# Patient Record
Sex: Female | Born: 1950 | Race: White | Hispanic: No | Marital: Married | Smoking: Never smoker
Health system: Southern US, Community
[De-identification: ages and names within clinical notes are randomized; demographics above are authoritative.]

## PROBLEM LIST (undated history)

## (undated) DIAGNOSIS — K222 Esophageal obstruction: Secondary | ICD-10-CM

## (undated) DIAGNOSIS — Z87442 Personal history of urinary calculi: Secondary | ICD-10-CM

## (undated) DIAGNOSIS — Z8719 Personal history of other diseases of the digestive system: Secondary | ICD-10-CM

## (undated) DIAGNOSIS — E785 Hyperlipidemia, unspecified: Secondary | ICD-10-CM

## (undated) DIAGNOSIS — E538 Deficiency of other specified B group vitamins: Secondary | ICD-10-CM

## (undated) DIAGNOSIS — Z8489 Family history of other specified conditions: Secondary | ICD-10-CM

## (undated) DIAGNOSIS — K219 Gastro-esophageal reflux disease without esophagitis: Secondary | ICD-10-CM

## (undated) DIAGNOSIS — M179 Osteoarthritis of knee, unspecified: Secondary | ICD-10-CM

## (undated) DIAGNOSIS — K5792 Diverticulitis of intestine, part unspecified, without perforation or abscess without bleeding: Secondary | ICD-10-CM

## (undated) DIAGNOSIS — E7800 Pure hypercholesterolemia, unspecified: Secondary | ICD-10-CM

## (undated) DIAGNOSIS — M1990 Unspecified osteoarthritis, unspecified site: Secondary | ICD-10-CM

## (undated) DIAGNOSIS — M1710 Unilateral primary osteoarthritis, unspecified knee: Secondary | ICD-10-CM

## (undated) DIAGNOSIS — M810 Age-related osteoporosis without current pathological fracture: Secondary | ICD-10-CM

## (undated) DIAGNOSIS — Z8619 Personal history of other infectious and parasitic diseases: Secondary | ICD-10-CM

## (undated) DIAGNOSIS — J302 Other seasonal allergic rhinitis: Secondary | ICD-10-CM

## (undated) DIAGNOSIS — K589 Irritable bowel syndrome without diarrhea: Secondary | ICD-10-CM

## (undated) DIAGNOSIS — G8929 Other chronic pain: Secondary | ICD-10-CM

## (undated) DIAGNOSIS — K449 Diaphragmatic hernia without obstruction or gangrene: Secondary | ICD-10-CM

## (undated) DIAGNOSIS — K635 Polyp of colon: Secondary | ICD-10-CM

## (undated) DIAGNOSIS — M549 Dorsalgia, unspecified: Secondary | ICD-10-CM

## (undated) DIAGNOSIS — Z9889 Other specified postprocedural states: Secondary | ICD-10-CM

## (undated) DIAGNOSIS — L719 Rosacea, unspecified: Secondary | ICD-10-CM

## (undated) DIAGNOSIS — D049 Carcinoma in situ of skin, unspecified: Secondary | ICD-10-CM

## (undated) HISTORY — PX: COLONOSCOPY W/ POLYPECTOMY: SHX1380

## (undated) HISTORY — DX: Polyp of colon: K63.5

## (undated) HISTORY — DX: Diverticulitis of intestine, part unspecified, without perforation or abscess without bleeding: K57.92

## (undated) HISTORY — DX: Irritable bowel syndrome, unspecified: K58.9

## (undated) HISTORY — DX: Osteoarthritis of knee, unspecified: M17.9

## (undated) HISTORY — PX: ABDOMINAL HYSTERECTOMY: SHX81

## (undated) HISTORY — PX: BUNIONECTOMY: SHX129

## (undated) HISTORY — DX: Age-related osteoporosis without current pathological fracture: M81.0

## (undated) HISTORY — DX: Rosacea, unspecified: L71.9

## (undated) HISTORY — DX: Personal history of other infectious and parasitic diseases: Z86.19

## (undated) HISTORY — DX: Deficiency of other specified B group vitamins: E53.8

## (undated) HISTORY — DX: Gastro-esophageal reflux disease without esophagitis: K21.9

## (undated) HISTORY — DX: Other chronic pain: G89.29

## (undated) HISTORY — DX: Unilateral primary osteoarthritis, unspecified knee: M17.10

## (undated) HISTORY — DX: Unspecified osteoarthritis, unspecified site: M19.90

## (undated) HISTORY — DX: Hyperlipidemia, unspecified: E78.5

## (undated) HISTORY — PX: TONSILLECTOMY AND ADENOIDECTOMY: SUR1326

## (undated) HISTORY — PX: FOOT SURGERY: SHX648

## (undated) HISTORY — DX: Carcinoma in situ of skin, unspecified: D04.9

## (undated) HISTORY — DX: Diaphragmatic hernia without obstruction or gangrene: K44.9

## (undated) HISTORY — PX: CERVICAL FUSION: SHX112

## (undated) HISTORY — DX: Other seasonal allergic rhinitis: J30.2

## (undated) HISTORY — PX: OTHER SURGICAL HISTORY: SHX169

## (undated) HISTORY — DX: Personal history of other diseases of the digestive system: Z87.19

## (undated) HISTORY — DX: Dorsalgia, unspecified: M54.9

## (undated) HISTORY — DX: Pure hypercholesterolemia, unspecified: E78.00

## (undated) HISTORY — DX: Esophageal obstruction: K22.2

---
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
## 1997-11-07 ENCOUNTER — Ambulatory Visit (HOSPITAL_COMMUNITY): Admission: RE | Admit: 1997-11-07 | Discharge: 1997-11-07 | Payer: Self-pay | Admitting: Obstetrics and Gynecology

## 1998-02-23 ENCOUNTER — Inpatient Hospital Stay (HOSPITAL_COMMUNITY): Admission: RE | Admit: 1998-02-23 | Discharge: 1998-02-25 | Payer: Self-pay | Admitting: Obstetrics and Gynecology

## 1998-12-28 ENCOUNTER — Ambulatory Visit (HOSPITAL_COMMUNITY): Admission: RE | Admit: 1998-12-28 | Discharge: 1998-12-28 | Payer: Self-pay | Admitting: Obstetrics and Gynecology

## 1999-06-06 ENCOUNTER — Other Ambulatory Visit: Admission: RE | Admit: 1999-06-06 | Discharge: 1999-06-06 | Payer: Self-pay | Admitting: Obstetrics and Gynecology

## 1999-06-06 ENCOUNTER — Encounter: Admission: RE | Admit: 1999-06-06 | Discharge: 1999-09-04 | Payer: Self-pay | Admitting: Neurology

## 2000-05-30 ENCOUNTER — Encounter: Admission: RE | Admit: 2000-05-30 | Discharge: 2000-07-03 | Payer: Self-pay | Admitting: *Deleted

## 2000-07-30 ENCOUNTER — Other Ambulatory Visit: Admission: RE | Admit: 2000-07-30 | Discharge: 2000-07-30 | Payer: Self-pay | Admitting: Obstetrics and Gynecology

## 2000-12-02 ENCOUNTER — Ambulatory Visit (HOSPITAL_COMMUNITY): Admission: RE | Admit: 2000-12-02 | Discharge: 2000-12-02 | Payer: Self-pay | Admitting: Orthopedic Surgery

## 2001-11-05 ENCOUNTER — Other Ambulatory Visit: Admission: RE | Admit: 2001-11-05 | Discharge: 2001-11-05 | Payer: Self-pay | Admitting: Obstetrics and Gynecology

## 2003-03-09 ENCOUNTER — Other Ambulatory Visit: Admission: RE | Admit: 2003-03-09 | Discharge: 2003-03-09 | Payer: Self-pay | Admitting: Obstetrics and Gynecology

## 2004-03-14 ENCOUNTER — Other Ambulatory Visit: Admission: RE | Admit: 2004-03-14 | Discharge: 2004-03-14 | Payer: Self-pay | Admitting: Obstetrics and Gynecology

## 2004-12-05 ENCOUNTER — Ambulatory Visit (HOSPITAL_BASED_OUTPATIENT_CLINIC_OR_DEPARTMENT_OTHER): Admission: RE | Admit: 2004-12-05 | Discharge: 2004-12-05 | Payer: Self-pay | Admitting: Specialist

## 2006-07-07 ENCOUNTER — Encounter: Admission: RE | Admit: 2006-07-07 | Discharge: 2006-07-07 | Payer: Self-pay | Admitting: Neurological Surgery

## 2006-09-18 ENCOUNTER — Observation Stay (HOSPITAL_COMMUNITY): Admission: RE | Admit: 2006-09-18 | Discharge: 2006-09-19 | Payer: Self-pay | Admitting: Neurological Surgery

## 2006-10-13 ENCOUNTER — Encounter: Admission: RE | Admit: 2006-10-13 | Discharge: 2006-10-13 | Payer: Self-pay | Admitting: Neurological Surgery

## 2006-12-12 ENCOUNTER — Encounter: Admission: RE | Admit: 2006-12-12 | Discharge: 2006-12-12 | Payer: Self-pay | Admitting: Neurological Surgery

## 2007-03-17 ENCOUNTER — Encounter: Admission: RE | Admit: 2007-03-17 | Discharge: 2007-03-17 | Payer: Self-pay | Admitting: Neurological Surgery

## 2008-09-30 DIAGNOSIS — Z8619 Personal history of other infectious and parasitic diseases: Secondary | ICD-10-CM

## 2008-09-30 HISTORY — DX: Personal history of other infectious and parasitic diseases: Z86.19

## 2010-12-26 ENCOUNTER — Other Ambulatory Visit: Payer: Self-pay | Admitting: Obstetrics and Gynecology

## 2010-12-27 ENCOUNTER — Ambulatory Visit
Admission: RE | Admit: 2010-12-27 | Discharge: 2010-12-27 | Disposition: A | Discharge status: Home / Self Care | Payer: 59 | Source: Ambulatory Visit | Attending: Obstetrics and Gynecology | Admitting: Obstetrics and Gynecology

## 2010-12-27 MED ORDER — IOHEXOL 300 MG/ML  SOLN
100.0000 mL | Freq: Once | INTRAMUSCULAR | Status: AC | PRN
  Administered 2010-12-27: 100 mL via INTRAVENOUS

## 2011-01-15 ENCOUNTER — Other Ambulatory Visit (HOSPITAL_COMMUNITY): Payer: Self-pay | Admitting: Obstetrics and Gynecology

## 2011-01-15 DIAGNOSIS — M84350A Stress fracture, pelvis, initial encounter for fracture: Secondary | ICD-10-CM

## 2011-01-16 ENCOUNTER — Ambulatory Visit (HOSPITAL_COMMUNITY)
Admission: RE | Admit: 2011-01-16 | Discharge: 2011-01-16 | Disposition: A | Discharge status: Home / Self Care | Payer: 59 | Source: Ambulatory Visit | Attending: Obstetrics and Gynecology | Admitting: Obstetrics and Gynecology

## 2011-01-16 DIAGNOSIS — Z780 Asymptomatic menopausal state: Secondary | ICD-10-CM | POA: Insufficient documentation

## 2011-01-16 DIAGNOSIS — Z87312 Personal history of (healed) stress fracture: Secondary | ICD-10-CM | POA: Insufficient documentation

## 2011-01-16 DIAGNOSIS — Z13820 Encounter for screening for osteoporosis: Secondary | ICD-10-CM | POA: Insufficient documentation

## 2011-01-16 DIAGNOSIS — M84350A Stress fracture, pelvis, initial encounter for fracture: Secondary | ICD-10-CM

## 2011-02-15 NOTE — Op Note (Signed)
[facility]  Patient:    Tammy Cooley, Tammy Cooley                           MRN: [id number] Proc. Date: 12/02/00 Attending:  Fayrene Fearing P. Aplington, M.D.                           Operative Report  PREOPERATIVE DIAGNOSIS:  Torn medial meniscus, right knee.  POSTOPERATIVE DIAGNOSES: 1. Torn medial meniscus, right knee. 2. 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  OPERATION:  Right knee arthroscopy with partial medial meniscectomy and debridement and shaving of mediofemoral condyle.  SURGEON:  Illene Labrador. Aplington, M.D.  ASSISTANT:  Nurse.  ANESTHESIA:  General.  PATHOLOGY AND INDICATIONS FOR PROCEDURE:  She had a complex tear involving the posterior half of the medial meniscus treated arthroscopically by Dr. Jeannie Fend on June 09, 1996.  She has had DD:  12/02/00 TD:  12/03/00 Job: [id number]

## 2011-02-15 NOTE — Op Note (Signed)
NAMECICI, RODRIGES NO.:  [id number]   MEDICAL RECORD NO.:  [id number]          PATIENT TYPE:  INP   LOCATION:  3172                         FACILITY:  [facility]   PHYSICIAN:  Tia Alert, MD     DATE OF BIRTH:  09-27-1951   DATE OF PROCEDURE:  09/18/2006  DATE OF DISCHARGE:                               OPERATIVE REPORT   PREOPERATIVE DIAGNOSIS:  Cervical spondylosis with cervical instability,  C3-4, C4-5, with neck and shoulder pain.   POSTOPERATIVE DIAGNOSIS:  Cervical spondylosis with cervical  instability, C3-4, C4-5, with neck and shoulder pain.   PROCEDURE:  1. Decompressive anterior cervical diskectomy, C3-4, C4-5.  2. Anterior cervical arthrodesis, C3-4 and C4-5, utilizing a 70mm MTF      allograft at C3-4 and a 6-mm MTF allograft at C4-5.  3.  Anterior      cervical plating, C3 to C5, inclusive, utilizing a 38-mm AccuFix      plate.   SURGEON:  Marikay Alar, MD   ASSISTANT:  Aliene Beams, MD   ANESTHESIA:  General endotracheal.   COMPLICATIONS:  None apparent.   INDICATIONS FOR THE PROCEDURE:  Ms. Steinmetz is a 59 year old female who was  referred by Dr. Ethelene Hal with neck pain with shoulder pain.  She had MRI, a  CT scan and then flexion and extension views which showed cervical  spondylosis with cervical instability at C3-4 and C4-5.  She had tried  medical management for quite some time without significant relief.  I  recommended a 2-level anterior cervical diskectomy with fusion and  plating at C3-4 and C4-5.  She understood the risks, benefits, inspected  outcome and wished to proceed.   DESCRIPTION OF PROCEDURE:  The patient was taken to the operating room  and after induction of adequate generalized endotracheal anesthesia, she  was placed in supine position.  Her right anterior cervical region was  prepped with DuraPrep and then draped in the usual sterile fashion.  Three milliliters of local anesthesia were injected and a transverse  incision was made to the right of midline and carried down to the  platysma muscle, which was elevated, opened and undermined with  Metzenbaum scissors.  I then dissected in a plane medial to the  sternocleidomastoid muscle and internal carotid artery and lateral to  the trachea and esophagus to expose C34 and C4-5; intraoperative  fluoroscopy confirmed my level and then the longus colli muscles were  taken down and the Shadowline retractors were placed.  The annulus was  incised and the initial diskectomy was done with pituitary rongeurs and  curved curettes.  We then used the high-speed drill to drill the  endplates for prepare for later arthrodesis.  We drilled down to the  level of the posterior longitudinal ligament, widening the disk space at  C3-4 for 7 mm and C4-5 to 6 mm.  We opened the posterior longitudinal  ligament with a nerve hook and then removed it in a circumferential  fashion while undercutting the bodies of C3-4 and C4 and C5 at C4-5 to  decompress the central  canal.  Bilateral foraminotomies were performed,  paying particular attention to C3-4 on the left side because of her left  shoulder pain.  We then irrigated with saline solution, dried all  bleeding points, measured our interspaces and placed a 7-mm MTF  allograft at C3-4 and a 6-mm MTF allograft at C4-5.  We used a 38-mm  AccuFix plate, placed two 12-mm variable-angle screws into the bodies of  C3, C4 and C5; these locked into position by the locking mechanism on  the plate.  We then irrigated with saline solution, dried all bleeding  points with bipolar cautery and once meticulous hemostasis was achieved,  closed our platysma with 3-0 Vicryl, closed the subcuticular tissue with  3-0 Vicryl and closed the skin with Benzoin and Steri-Strips.  The  drapes removed and a sterile dressing was applied.  The patient was  awakened from general anesthesia and transferred to the recovery room in  stable condition.  At the  end of the procedure, all sponge, needle and  instrument counts were correct.      Tia Alert, MD  Electronically Signed     DSJ/MEDQ  D:  09/18/2006  T:  09/19/2006  Job:  [id number]

## 2011-02-15 NOTE — Op Note (Signed)
Tammy Cooley, Tammy Cooley                  ACCOUNT NO.:  [id number]   MEDICAL RECORD NO.:  [id number]          PATIENT TYPE:  AMB   LOCATION:  [facility]                         FACILITY:  [facility]   PHYSICIAN:  Jene Every, M.D.    DATE OF BIRTH:  Oct 29, 1950   DATE OF PROCEDURE:  12/05/2004  DATE OF DISCHARGE:                                 OPERATIVE REPORT   PREOPERATIVE DIAGNOSIS:  Degenerative joint disease, medial meniscal tear of  the left knee.   POSTOPERATIVE DIAGNOSIS:  Degenerative joint disease, medial meniscal tear  of the left knee, grade 3 chondromalacia and grade 4 chondromalacia, medial  femoral condyle, grade 3 chondromalacia of the patella, grade 2  chondromalacia, lateral tibial plateau, medial tibial plateau.   BRIEF HISTORY/INDICATIONS:  This is a 60 year old who is status post  arthroscopy in the past for degenerative changes and meniscal tear.  She had  recent pain.  Injected in the knee a few times.  Had persistent pain.  Discussed repeat arthroscopic debridement, which she has had success from in  the past.  The pain is worse with activity, better with rest.  She had been  doing exercise and anti-inflammatories to no avail.  She had occasional  mechanical symptoms.  She has ongoing MRIs.  X-rays demonstrated medial  joint space narrowing, consistent with osteoarthrosis.  Operative  intervention is indicated for diagnostic arthroscopy and appropriate  treatment.  The risks and benefits were discussed, including bleeding,  infection, damage to vascular structures, no changes in the symptoms,  worsening symptoms, the need for repeat debridement in the future, the need  for _________, anesthetic complications, the need for total knee  arthroplasty, etc.   TECHNIQUE:  With the patient in the supine position after induction of  adequate general anesthesia and 1 gm of Kefzol, the left lower extremity was  prepped and draped in the usual sterile fashion.  A lateral  parapatellar  portal and preexisting surgical portal was utilized.  The _________ was  cannulated atraumatically placed as was the camera.  Irrigant was utilized  to insufflate the joint.  Under direct visualization, an 18 gauge needle was  utilized to localize the medial parapatellar portals, which was fashioned  with a #11 blade after localization with an 18 gauge needle.  Noted  initially was extensive grade III changes and a chondral flap tear of the  medial femoral condyle.  A shaver was introduced and utilized to perform a  chondroplasty of the medial femoral condyle.  There were some grade 4  changes on the AP weightbearing surface.  There was a complex tear of the  posterior portion of the medial meniscus, unstable to probe palpation;  therefore, a basket rongeur was inserted and utilized to perform a partial  medial meniscectomy to a stable base.  The residual was stable to probe-  palpation.  Approximately 50% of the posterior third of the meniscus had to  be resected.  The remainder of the surfaces were palpated, as there were  grade III changes of the tibial plateau.  Full examination of the femoral  condyle  revealed extensive grade III and some degree IV changes of the  femoral condyle.  The rest of the meniscus was stable to probe-palpation.  ACL and PCL were essentially unremarkable.  The lateral compartments  revealed grade II changes of the entire tibial plateau.  The meniscus was  stable to probe-palpation without evidence of tear.  Examined the top and  bottom of the meniscus throughout without evidence of tear.  The surfaces  were probed and palpated.  I see no evidence of chondral defect.  Underneath  the patella are some minor grade III changes.  This was shaved as well.  There was normal  patellofemoral tracking noted.   Next, the knee was copiously lavaged.  All compartments were re-examined,  including the gutter.  No loose cartilaginous debris or other pathology   amenable to arthroscopic intervention.  The knee was copiously lavaged.  The  portals were closed with 4-0 nylon simple sutures.  Then 0.25% Marcaine with  epinephrine was infiltrated into  the wound.  The wound was dressed sterilely.  She was awakened without  difficulty and transported to the recovery room in satisfactory condition.   The patient tolerated the procedure well.  There were no consultation.      JB/MEDQ  D:  12/05/2004  T:  12/05/2004  Job:  [id number]

## 2011-02-15 NOTE — Op Note (Signed)
NAMEADJA, RUFF                  ACCOUNT NO.:  [id number]   MEDICAL RECORD NO.:  [id number]          PATIENT TYPE:  AMB   LOCATION:  [facility]                         FACILITY:  [facility]   PHYSICIAN:  Jene Every, M.D.    DATE OF BIRTH:  [birth year]   DATE OF PROCEDURE:  12/05/2004  DATE OF DISCHARGE:  12/05/2004                                 OPERATIVE REPORT   ADDENDUM:  I believe the procedure was actually left out of the operative  report.  Under Procedure category:  Left knee arthroscopy, partial medial  meniscectomy, chondroplasty of the medial femoral condyle, medial tibial  plateau, patella.  That is the end of that procedure addendum.   Now under the Brief History and Indications, the last sentence, there is  an open slot.  It should read:  The need for medications, anesthetic  complications, etc.   Under Technique, the third sentence should read:  The ingress cannula was  atraumatically placed and the camera was inserted.      JB/MEDQ  D:  12/25/2004  T:  12/25/2004  Job:  [id number]

## 2011-02-15 NOTE — Op Note (Signed)
[facility]  Patient:    Tammy Cooley, Tammy Cooley                         MRN: [id number] Proc. Date: 12/02/00 Adm. Date:  [id number] Attending:  Marlowe Kays Page                           Operative Report  PREOPERATIVE DIAGNOSIS:  Torn medial meniscus, right knee.  POSTOPERATIVE DIAGNOSES: 1. Torn medial meniscus, right knee. 2. Grade I to II chondromalacia of the mediofemoral condyle, right knee.  OPERATION:  Right knee arthroscopy with partial medial meniscectomy and debridement and shaving of mediofemoral condyle.  SURGEON:  Illene Labrador. Aplington, M.D.  ASSISTANT:  Nurse.  ANESTHESIA:  General.  PATHOLOGY AND INDICATIONS FOR PROCEDURE:  In 1997, she had a complex posterior horn tear treated arthroscopically by Dr. Jeannie Fend.  The right knee symptoms began some two to three years ago and has gotten worse over the last six months to the point where she is having problems doing any squatting, bending, or working in the garden.  Plain x-rays look normal.  Because of what appeared to be a clearcut picture of torn medial meniscus, no MRI was performed, and she is here today for the above-mentioned surgery.  See operative description below.  DESCRIPTION OF PROCEDURE:  Satisfactory general anesthesia, pneumatic tourniquet applied, stabilizer.  The right knee was prepped with DuraPrep and draped in sterile field.  Superior medial saline inflow.  First through an anterolateral portal in medial compartment, knee was evaluated.   Immediately apparent was a good bit of reactive synovitis in the anterior third of the joint which was resected and found to be adherent to the medial meniscus which had been torn.  There was also an unusual anatomy to the meniscus going well in front of the ACL.  All this was pictured.  The medial meniscus was shaved down until smooth.  Corresponding to the tear in the medial meniscus and the synovitis was a small area of chondromalacia  of the weeping portion of the mediofemoral condyle which was partially detached.  I managed this by debriding it down with baskets and shaving down gently until smooth with a 3.5 shaver.  On looking posteriorly, the medial meniscus appeared to be completely normal and was stable on probing.  Looking at the medial gutter and suprapatellar area, no abnormalities were noted.  I then reversed the portals. Her ACL was intact.  The lateral joint looked normal and representative picture was taken.  Looking at the lateral gutter, no abnormalities were noted.  The knee joint was then irrigated until clear.  All fluid possible was removed.  The two anterior portals were closed with 4-0 nylon 20 cc 0.5% Marcaine with adrenaline, 4 mg of morphine were then instilled through inflow apparatus which were removed and portal closed with 4-0 nylon as well.  Betadine and Adaptic dry sterile dressing were applied.  Tourniquet was released.  She tolerated the procedure well and was taken to the recovery room in satisfactory condition with no known complications. DD:  12/02/00 TD:  12/03/00 Job: [id number]

## 2011-04-09 ENCOUNTER — Encounter (HOSPITAL_COMMUNITY): Payer: 59 | Attending: Obstetrics and Gynecology

## 2011-04-09 DIAGNOSIS — M949 Disorder of cartilage, unspecified: Secondary | ICD-10-CM | POA: Insufficient documentation

## 2011-04-09 DIAGNOSIS — M899 Disorder of bone, unspecified: Secondary | ICD-10-CM | POA: Insufficient documentation

## 2011-05-06 ENCOUNTER — Other Ambulatory Visit (HOSPITAL_COMMUNITY): Payer: Self-pay | Admitting: Podiatry

## 2011-05-06 DIAGNOSIS — S92009A Unspecified fracture of unspecified calcaneus, initial encounter for closed fracture: Secondary | ICD-10-CM

## 2011-05-06 DIAGNOSIS — S92109A Unspecified fracture of unspecified talus, initial encounter for closed fracture: Secondary | ICD-10-CM

## 2011-05-14 ENCOUNTER — Encounter (HOSPITAL_COMMUNITY): Payer: 59

## 2011-05-14 ENCOUNTER — Other Ambulatory Visit (HOSPITAL_COMMUNITY): Payer: 59

## 2013-07-23 ENCOUNTER — Encounter: Payer: Self-pay | Admitting: Gastroenterology

## 2013-08-30 ENCOUNTER — Ambulatory Visit: Payer: 59 | Admitting: Gastroenterology

## 2014-11-28 ENCOUNTER — Encounter: Payer: Self-pay | Admitting: *Deleted

## 2015-12-04 DIAGNOSIS — M810 Age-related osteoporosis without current pathological fracture: Secondary | ICD-10-CM | POA: Insufficient documentation

## 2016-12-20 ENCOUNTER — Emergency Department (HOSPITAL_COMMUNITY): Admission: EM | Admit: 2016-12-20 | Discharge: 2016-12-20 | Discharge status: Absent without leave | Payer: Self-pay

## 2017-03-05 ENCOUNTER — Other Ambulatory Visit: Payer: Self-pay | Admitting: Physician Assistant

## 2017-03-05 DIAGNOSIS — M8580 Other specified disorders of bone density and structure, unspecified site: Secondary | ICD-10-CM

## 2017-03-05 DIAGNOSIS — M810 Age-related osteoporosis without current pathological fracture: Secondary | ICD-10-CM

## 2017-03-21 ENCOUNTER — Other Ambulatory Visit: Payer: Self-pay | Admitting: Obstetrics & Gynecology

## 2017-03-21 DIAGNOSIS — N63 Unspecified lump in unspecified breast: Secondary | ICD-10-CM

## 2017-04-21 ENCOUNTER — Ambulatory Visit
Admission: RE | Admit: 2017-04-21 | Discharge: 2017-04-21 | Disposition: A | Discharge status: Home / Self Care | Payer: BLUE CROSS/BLUE SHIELD | Source: Ambulatory Visit | Attending: Physician Assistant | Admitting: Physician Assistant

## 2017-04-21 ENCOUNTER — Ambulatory Visit
Admission: RE | Admit: 2017-04-21 | Discharge: 2017-04-21 | Disposition: A | Discharge status: Home / Self Care | Payer: BLUE CROSS/BLUE SHIELD | Source: Ambulatory Visit | Attending: Obstetrics & Gynecology | Admitting: Obstetrics & Gynecology

## 2017-04-21 ENCOUNTER — Other Ambulatory Visit: Payer: Self-pay

## 2017-04-21 DIAGNOSIS — M810 Age-related osteoporosis without current pathological fracture: Secondary | ICD-10-CM

## 2017-04-21 DIAGNOSIS — M8580 Other specified disorders of bone density and structure, unspecified site: Secondary | ICD-10-CM

## 2017-04-21 DIAGNOSIS — N63 Unspecified lump in unspecified breast: Secondary | ICD-10-CM

## 2017-05-09 DIAGNOSIS — K580 Irritable bowel syndrome with diarrhea: Secondary | ICD-10-CM | POA: Insufficient documentation

## 2017-05-09 DIAGNOSIS — K219 Gastro-esophageal reflux disease without esophagitis: Secondary | ICD-10-CM | POA: Insufficient documentation

## 2017-10-29 DIAGNOSIS — M179 Osteoarthritis of knee, unspecified: Secondary | ICD-10-CM | POA: Insufficient documentation

## 2018-05-25 DIAGNOSIS — M7742 Metatarsalgia, left foot: Secondary | ICD-10-CM | POA: Diagnosis not present

## 2018-05-25 DIAGNOSIS — M79672 Pain in left foot: Secondary | ICD-10-CM | POA: Diagnosis not present

## 2018-05-25 DIAGNOSIS — M21612 Bunion of left foot: Secondary | ICD-10-CM | POA: Diagnosis not present

## 2018-06-12 DIAGNOSIS — Z01419 Encounter for gynecological examination (general) (routine) without abnormal findings: Secondary | ICD-10-CM | POA: Diagnosis not present

## 2018-06-12 DIAGNOSIS — Z1231 Encounter for screening mammogram for malignant neoplasm of breast: Secondary | ICD-10-CM | POA: Diagnosis not present

## 2018-06-19 DIAGNOSIS — M79672 Pain in left foot: Secondary | ICD-10-CM | POA: Diagnosis not present

## 2018-06-22 DIAGNOSIS — M21612 Bunion of left foot: Secondary | ICD-10-CM | POA: Diagnosis not present

## 2018-06-22 DIAGNOSIS — M2022 Hallux rigidus, left foot: Secondary | ICD-10-CM | POA: Diagnosis not present

## 2018-06-22 DIAGNOSIS — M7742 Metatarsalgia, left foot: Secondary | ICD-10-CM | POA: Diagnosis not present

## 2018-07-13 ENCOUNTER — Other Ambulatory Visit (HOSPITAL_COMMUNITY): Payer: Self-pay | Admitting: Orthopedic Surgery

## 2018-09-09 ENCOUNTER — Encounter (HOSPITAL_BASED_OUTPATIENT_CLINIC_OR_DEPARTMENT_OTHER): Payer: Self-pay | Admitting: *Deleted

## 2018-09-09 ENCOUNTER — Other Ambulatory Visit: Payer: Self-pay

## 2018-09-17 ENCOUNTER — Ambulatory Visit (HOSPITAL_BASED_OUTPATIENT_CLINIC_OR_DEPARTMENT_OTHER): Payer: Medicare HMO | Admitting: Anesthesiology

## 2018-09-17 ENCOUNTER — Encounter (HOSPITAL_BASED_OUTPATIENT_CLINIC_OR_DEPARTMENT_OTHER)
Admission: RE | Disposition: A | Discharge status: Home / Self Care | Payer: Self-pay | Source: Home / Self Care | Attending: Orthopedic Surgery

## 2018-09-17 ENCOUNTER — Ambulatory Visit (HOSPITAL_BASED_OUTPATIENT_CLINIC_OR_DEPARTMENT_OTHER)
Admission: RE | Admit: 2018-09-17 | Discharge: 2018-09-17 | Disposition: A | Discharge status: Home / Self Care | Payer: Medicare HMO | Attending: Orthopedic Surgery | Admitting: Orthopedic Surgery

## 2018-09-17 ENCOUNTER — Other Ambulatory Visit: Payer: Self-pay

## 2018-09-17 ENCOUNTER — Encounter (HOSPITAL_BASED_OUTPATIENT_CLINIC_OR_DEPARTMENT_OTHER): Payer: Self-pay

## 2018-09-17 DIAGNOSIS — Z79899 Other long term (current) drug therapy: Secondary | ICD-10-CM | POA: Insufficient documentation

## 2018-09-17 DIAGNOSIS — M170 Bilateral primary osteoarthritis of knee: Secondary | ICD-10-CM | POA: Diagnosis not present

## 2018-09-17 DIAGNOSIS — Z7982 Long term (current) use of aspirin: Secondary | ICD-10-CM | POA: Diagnosis not present

## 2018-09-17 DIAGNOSIS — K219 Gastro-esophageal reflux disease without esophagitis: Secondary | ICD-10-CM | POA: Diagnosis not present

## 2018-09-17 DIAGNOSIS — M2022 Hallux rigidus, left foot: Secondary | ICD-10-CM | POA: Diagnosis not present

## 2018-09-17 DIAGNOSIS — M19072 Primary osteoarthritis, left ankle and foot: Secondary | ICD-10-CM | POA: Diagnosis not present

## 2018-09-17 DIAGNOSIS — M7742 Metatarsalgia, left foot: Secondary | ICD-10-CM | POA: Diagnosis not present

## 2018-09-17 DIAGNOSIS — G8918 Other acute postprocedural pain: Secondary | ICD-10-CM | POA: Diagnosis not present

## 2018-09-17 DIAGNOSIS — M21612 Bunion of left foot: Secondary | ICD-10-CM | POA: Insufficient documentation

## 2018-09-17 HISTORY — PX: ARTHRODESIS METATARSALPHALANGEAL JOINT (MTPJ): SHX6566

## 2018-09-17 SURGERY — FUSION, JOINT, GREAT TOE
Anesthesia: General | Site: Foot | Laterality: Left

## 2018-09-17 MED ORDER — BUPIVACAINE-EPINEPHRINE (PF) 0.5% -1:200000 IJ SOLN
INTRAMUSCULAR | Status: DC | PRN
  Administered 2018-09-17: 25 mL via PERINEURAL

## 2018-09-17 MED ORDER — FENTANYL CITRATE (PF) 100 MCG/2ML IJ SOLN
50.0000 ug | INTRAMUSCULAR | Status: DC | PRN
  Administered 2018-09-17: 50 ug via INTRAVENOUS

## 2018-09-17 MED ORDER — CEFAZOLIN SODIUM-DEXTROSE 2-4 GM/100ML-% IV SOLN
INTRAVENOUS | Status: AC
  Filled 2018-09-17: qty 100

## 2018-09-17 MED ORDER — FENTANYL CITRATE (PF) 100 MCG/2ML IJ SOLN
INTRAMUSCULAR | Status: AC
  Filled 2018-09-17: qty 2

## 2018-09-17 MED ORDER — OXYCODONE HCL 5 MG PO TABS: 5.0000 mg | ORAL_TABLET | ORAL | 0 refills | Status: AC | PRN

## 2018-09-17 MED ORDER — MIDAZOLAM HCL 2 MG/2ML IJ SOLN
1.0000 mg | INTRAMUSCULAR | Status: DC | PRN
  Administered 2018-09-17: 1 mg via INTRAVENOUS

## 2018-09-17 MED ORDER — DOCUSATE SODIUM 100 MG PO CAPS: 100.0000 mg | ORAL_CAPSULE | Freq: Two times a day (BID) | ORAL | 0 refills | Status: DC

## 2018-09-17 MED ORDER — FENTANYL CITRATE (PF) 100 MCG/2ML IJ SOLN: 25.0000 ug | INTRAMUSCULAR | Status: DC | PRN

## 2018-09-17 MED ORDER — SENNA 8.6 MG PO TABS: 2.0000 | ORAL_TABLET | Freq: Two times a day (BID) | ORAL | 0 refills | Status: DC

## 2018-09-17 MED ORDER — PROPOFOL 10 MG/ML IV BOLUS
INTRAVENOUS | Status: DC | PRN
  Administered 2018-09-17: 150 mg via INTRAVENOUS

## 2018-09-17 MED ORDER — CHLORHEXIDINE GLUCONATE 4 % EX LIQD: 60.0000 mL | Freq: Once | CUTANEOUS | Status: DC

## 2018-09-17 MED ORDER — CEFAZOLIN SODIUM-DEXTROSE 2-4 GM/100ML-% IV SOLN
2.0000 g | INTRAVENOUS | Status: AC
  Administered 2018-09-17: 2 g via INTRAVENOUS

## 2018-09-17 MED ORDER — SCOPOLAMINE 1 MG/3DAYS TD PT72: 1.0000 | MEDICATED_PATCH | Freq: Once | TRANSDERMAL | Status: DC | PRN

## 2018-09-17 MED ORDER — DEXAMETHASONE SODIUM PHOSPHATE 4 MG/ML IJ SOLN
INTRAMUSCULAR | Status: DC | PRN
  Administered 2018-09-17: 10 mg via INTRAVENOUS

## 2018-09-17 MED ORDER — SODIUM CHLORIDE 0.9 % IV SOLN: INTRAVENOUS | Status: DC

## 2018-09-17 MED ORDER — MIDAZOLAM HCL 2 MG/2ML IJ SOLN
INTRAMUSCULAR | Status: AC
  Filled 2018-09-17: qty 2

## 2018-09-17 MED ORDER — LACTATED RINGERS IV SOLN
INTRAVENOUS | Status: DC
  Administered 2018-09-17 (×2): via INTRAVENOUS

## 2018-09-17 SURGICAL SUPPLY — 84 items
BANDAGE ACE 4X5 VEL STRL LF (GAUZE/BANDAGES/DRESSINGS) IMPLANT
BANDAGE ESMARK 6X9 LF (GAUZE/BANDAGES/DRESSINGS) IMPLANT
BIT DRILL 2.0 (BIT) ×2
BIT DRILL 2.9 CANN QC NONSTRL (BIT) ×1 IMPLANT
BIT DRILL 2XNS DISP SS SM FRAG (BIT) IMPLANT
BIT DRL 2XNS DISP SS SM FRAG (BIT) ×1
BLADE AVERAGE 25X9 (BLADE) ×1 IMPLANT
BLADE MICRO SAGITTAL (BLADE) ×1 IMPLANT
BLADE OSC/SAG .038X5.5 CUT EDG (BLADE) IMPLANT
BLADE SURG 15 STRL LF DISP TIS (BLADE) ×2 IMPLANT
BLADE SURG 15 STRL SS (BLADE) ×4
BNDG CMPR 9X6 STRL LF SNTH (GAUZE/BANDAGES/DRESSINGS)
BNDG COHESIVE 4X5 TAN STRL (GAUZE/BANDAGES/DRESSINGS) IMPLANT
BNDG COHESIVE 6X5 TAN STRL LF (GAUZE/BANDAGES/DRESSINGS) IMPLANT
BNDG CONFORM 3 STRL LF (GAUZE/BANDAGES/DRESSINGS) ×3 IMPLANT
BNDG ESMARK 6X9 LF (GAUZE/BANDAGES/DRESSINGS)
BOOT STEPPER DURA LG (SOFTGOODS) IMPLANT
BOOT STEPPER DURA MED (SOFTGOODS) ×1 IMPLANT
BOOT STEPPER DURA SM (SOFTGOODS) IMPLANT
BOOT STEPPER DURA XLG (SOFTGOODS) IMPLANT
CHLORAPREP W/TINT 26ML (MISCELLANEOUS) ×2 IMPLANT
COVER BACK TABLE 60X90IN (DRAPES) ×2 IMPLANT
COVER WAND RF STERILE (DRAPES) IMPLANT
CUFF TOURNIQUET SINGLE 34IN LL (TOURNIQUET CUFF) ×1 IMPLANT
DRAPE EXTREMITY T 121X128X90 (DRAPE) ×2 IMPLANT
DRAPE OEC MINIVIEW 54X84 (DRAPES) ×2 IMPLANT
DRAPE U-SHAPE 47X51 STRL (DRAPES) ×2 IMPLANT
DRSG MEPITEL 4X7.2 (GAUZE/BANDAGES/DRESSINGS) ×2 IMPLANT
DRSG PAD ABDOMINAL 8X10 ST (GAUZE/BANDAGES/DRESSINGS) ×4 IMPLANT
ELECT REM PT RETURN 9FT ADLT (ELECTROSURGICAL) ×2
ELECTRODE REM PT RTRN 9FT ADLT (ELECTROSURGICAL) ×1 IMPLANT
GAUZE SPONGE 4X4 12PLY STRL (GAUZE/BANDAGES/DRESSINGS) ×2 IMPLANT
GLOVE BIO SURGEON STRL SZ 6.5 (GLOVE) ×1 IMPLANT
GLOVE BIO SURGEON STRL SZ8 (GLOVE) ×2 IMPLANT
GLOVE BIOGEL PI IND STRL 7.0 (GLOVE) IMPLANT
GLOVE BIOGEL PI IND STRL 8 (GLOVE) ×2 IMPLANT
GLOVE BIOGEL PI INDICATOR 7.0 (GLOVE) ×1
GLOVE BIOGEL PI INDICATOR 8 (GLOVE) ×2
GLOVE ECLIPSE 8.0 STRL XLNG CF (GLOVE) ×2 IMPLANT
GOWN STRL REUS W/ TWL LRG LVL3 (GOWN DISPOSABLE) ×1 IMPLANT
GOWN STRL REUS W/ TWL XL LVL3 (GOWN DISPOSABLE) ×2 IMPLANT
GOWN STRL REUS W/TWL LRG LVL3 (GOWN DISPOSABLE) ×2
GOWN STRL REUS W/TWL XL LVL3 (GOWN DISPOSABLE) ×4
K-WIRE ACE 1.6X6 (WIRE) ×4
KWIRE ACE 1.6X6 (WIRE) IMPLANT
NEEDLE HYPO 22GX1.5 SAFETY (NEEDLE) IMPLANT
PACK BASIN DAY SURGERY FS (CUSTOM PROCEDURE TRAY) ×2 IMPLANT
PAD CAST 4YDX4 CTTN HI CHSV (CAST SUPPLIES) ×1 IMPLANT
PADDING CAST ABS 4INX4YD NS (CAST SUPPLIES)
PADDING CAST ABS COTTON 4X4 ST (CAST SUPPLIES) IMPLANT
PADDING CAST COTTON 4X4 STRL (CAST SUPPLIES) ×2
PADDING CAST COTTON 6X4 STRL (CAST SUPPLIES) IMPLANT
PENCIL BUTTON HOLSTER BLD 10FT (ELECTRODE) ×2 IMPLANT
PLATE SM 1/4 TUBULAR 6H (Plate) ×1 IMPLANT
SANITIZER HAND PURELL 535ML FO (MISCELLANEOUS) ×2 IMPLANT
SCREW CORTICAL 2.7MM  14MM (Screw) ×1 IMPLANT
SCREW CORTICAL 2.7MM  18MM (Screw) ×2 IMPLANT
SCREW CORTICAL 2.7MM 14MM (Screw) IMPLANT
SCREW CORTICAL 2.7MM 16MM (Screw) ×1 IMPLANT
SCREW CORTICAL 2.7MM 18MM (Screw) IMPLANT
SCREW HCS TWIST-OFF 2.0X12MM (Screw) ×1 IMPLANT
SCREW HCS TWIST-OFF 2.0X14MM (Screw) ×1 IMPLANT
SCREW LAG  RD HEAD 4.0 34 LTH (Screw) ×1 IMPLANT
SCREW LAG RD HEAD 4.0 34 LTH (Screw) IMPLANT
SHEET MEDIUM DRAPE 40X70 STRL (DRAPES) ×2 IMPLANT
SLEEVE SCD COMPRESS KNEE MED (MISCELLANEOUS) ×1 IMPLANT
SPLINT FAST PLASTER 5X30 (CAST SUPPLIES)
SPLINT PLASTER CAST FAST 5X30 (CAST SUPPLIES) IMPLANT
SPONGE LAP 18X18 RF (DISPOSABLE) ×2 IMPLANT
SPONGE SURGIFOAM ABS GEL 12-7 (HEMOSTASIS) IMPLANT
STOCKINETTE 6  STRL (DRAPES) ×1
STOCKINETTE 6 STRL (DRAPES) ×1 IMPLANT
SUCTION FRAZIER HANDLE 10FR (MISCELLANEOUS) ×1
SUCTION TUBE FRAZIER 10FR DISP (MISCELLANEOUS) ×1 IMPLANT
SUT ETHILON 3 0 PS 1 (SUTURE) ×2 IMPLANT
SUT MNCRL AB 3-0 PS2 18 (SUTURE) ×2 IMPLANT
SUT VIC AB 0 SH 27 (SUTURE) IMPLANT
SUT VIC AB 2-0 SH 27 (SUTURE) ×2
SUT VIC AB 2-0 SH 27XBRD (SUTURE) ×1 IMPLANT
SYR BULB 3OZ (MISCELLANEOUS) ×2 IMPLANT
SYR CONTROL 10ML LL (SYRINGE) IMPLANT
TOWEL GREEN STERILE FF (TOWEL DISPOSABLE) ×4 IMPLANT
TUBE CONNECTING 20X1/4 (TUBING) ×2 IMPLANT
UNDERPAD 30X30 (UNDERPADS AND DIAPERS) ×2 IMPLANT

## 2018-09-17 NOTE — Progress Notes (Signed)
Assisted Dr. Rob Fitzgerald with left, ultrasound guided, popliteal block. Side rails up, monitors on throughout procedure. See vital signs in flow sheet. Tolerated Procedure well. 

## 2018-09-17 NOTE — H&P (Signed)
Tammy Cooley is an 67 y.o. female.   Chief Complaint: Left forefoot pain HPI: The patient is a 67 year old female without significant past medical history.  She has a long history of left forefoot pain due to hallux rigidus and metatarsalgia.  She has failed nonoperative treatment to date including activity modification, oral anti-inflammatories and shoewear modification.  She presents now for surgical treatment of these painful conditions.  Past Medical History:  Diagnosis Date  . Acne rosacea   . Back pain   . Colon polyp, hyperplastic   . Diverticulitis   . GERD (gastroesophageal reflux disease)   . History of shingles 2010  . OA (osteoarthritis) of knee    bilateral  . OP (osteoporosis)   . Seasonal allergies     Past Surgical History:  Procedure Laterality Date  . ABDOMINAL HYSTERECTOMY     total 1999  . COLONOSCOPY W/ POLYPECTOMY    . left knee arthroscopy    . right knee arthroscopy      Family History  Problem Relation Age of Onset  . Breast cancer Mother    Social History:  reports that she has never smoked. She has never used smokeless tobacco. She reports that she does not drink alcohol or use drugs.  Allergies: No Known Allergies  Medications Prior to Admission  Medication Sig Dispense Refill  . aspirin EC 81 MG tablet Take 81 mg by mouth daily.    . calcium carbonate (TUMS) 500 MG chewable tablet Chew 1 tablet by mouth daily.    . cholecalciferol (VITAMIN D3) 25 MCG (1000 UT) tablet Take 1,000 Units by mouth daily.    Marland Kitchen loratadine (CLARITIN) 10 MG tablet Take 10 mg by mouth daily.    . naproxen sodium (ALEVE) 220 MG tablet Take 220 mg by mouth.      No results found for this or any previous visit (from the past 48 hour(s)). No results found.  ROS no recent fever, chills, nausea, vomiting or changes in her appetite  Blood pressure 129/81, pulse 78, temperature 97.9 F (36.6 C), temperature source Oral, resp. rate 16, height 5\' 1"  (1.549 m), weight 67.4  kg, SpO2 99 %. Physical Exam  Well-nourished well-developed woman in no apparent distress.  Alert and oriented x4.  Mood and affect are normal.  Extraocular motions are intact.  Respirations are unlabored.  Gait is normal.  The left hallux has decreased range of motion and tenderness to palpation at the MP joint.  Skin is healthy and intact.  Pulses are palpable.  No lymphadenopathy.  5 out of 5 strength in plantar flexion and dorsiflexion of the ankle and toes.  Tender to palpation at the second TMT joint and second MTP joint.  Assessment/Plan Left hallux rigidus and second metatarsalgia -to the operating room today for hallux MP joint arthrodesis and second metatarsal Weil osteotomy.  The risks and benefits of the alternative treatment options have been discussed in detail.  The patient wishes to proceed with surgery and specifically understands risks of bleeding, infection, nerve damage, blood clots, need for additional surgery, amputation and death.   Wylene Simmer, MD 10-03-2018, 10:02 AM

## 2018-09-17 NOTE — Anesthesia Preprocedure Evaluation (Addendum)
Anesthesia Evaluation  Patient identified by MRN, date of birth, ID band Patient awake    Reviewed: Allergy & Precautions, NPO status , Patient's Chart, lab work & pertinent test results  Airway Mallampati: II  TM Distance: >3 FB Neck ROM: Full    Dental   Pulmonary neg pulmonary ROS,    breath sounds clear to auscultation       Cardiovascular negative cardio ROS   Rhythm:Regular Rate:Normal     Neuro/Psych negative neurological ROS     GI/Hepatic Neg liver ROS, GERD  ,  Endo/Other  negative endocrine ROS  Renal/GU negative Renal ROS     Musculoskeletal  (+) Arthritis ,   Abdominal   Peds  Hematology negative hematology ROS (+)   Anesthesia Other Findings   Reproductive/Obstetrics                             Anesthesia Physical Anesthesia Plan  ASA: II  Anesthesia Plan: General   Post-op Pain Management:  Regional for Post-op pain   Induction: Intravenous  PONV Risk Score and Plan: 3 and Dexamethasone, Ondansetron and Treatment may vary due to age or medical condition  Airway Management Planned: LMA  Additional Equipment:   Intra-op Plan:   Post-operative Plan: Extubation in OR  Informed Consent: I have reviewed the patients History and Physical, chart, labs and discussed the procedure including the risks, benefits and alternatives for the proposed anesthesia with the patient or authorized representative who has indicated his/her understanding and acceptance.   Dental advisory given  Plan Discussed with: CRNA  Anesthesia Plan Comments:       Anesthesia Quick Evaluation

## 2018-09-17 NOTE — Discharge Instructions (Addendum)
Tammy Simmer, MD [place]  Please read the following information regarding your care after surgery.  Medications  You only need a prescription for the narcotic pain medicine (ex. oxycodone, Percocet, Norco).  All of the other medicines listed below are available over the counter. X Aleve 2 pills twice a day for the first 3 days after surgery. X acetominophen (Tylenol) 650 mg every 4-6 hours as you need for minor to moderate pain X oxycodone as prescribed for severe pain  Narcotic pain medicine (ex. oxycodone, Percocet, Vicodin) will cause constipation.  To prevent this problem, take the following medicines while you are taking any pain medicine. X docusate sodium (Colace) 100 mg twice a day X senna (Senokot) 2 tablets twice a day  Weight Bearing X Bear weight only on your operated foot in the CAM boot.  Cast / Splint / Dressing X Keep your splint, cast or dressing clean and dry.  Dont put anything (coat hanger, pencil, etc) down inside of it.  If it gets damp, use a hair dryer on the cool setting to dry it.  If it gets soaked, call the office to schedule an appointment for a cast change.   After your dressing, cast or splint is removed; you may shower, but do not soak or scrub the wound.  Allow the water to run over it, and then gently pat it dry.  Swelling It is normal for you to have swelling where you had surgery.  To reduce swelling and pain, keep your toes above your nose for at least 3 days after surgery.  It may be necessary to keep your foot or leg elevated for several weeks.  If it hurts, it should be elevated.  Follow Up Call my office at [phone] when you are discharged from the hospital or surgery center to schedule an appointment to be seen two weeks after surgery.  Call my office at [phone] if you develop a fever >101.5 F, nausea, vomiting, bleeding from the surgical site or severe pain.        Post Anesthesia Home Care  Instructions  Activity: Get plenty of rest for the remainder of the day. A responsible individual must stay with you for 24 hours following the procedure.  For the next 24 hours, DO NOT: -Drive a car -Paediatric nurse -Drink alcoholic beverages -Take any medication unless instructed by your physician -Make any legal decisions or sign important papers.  Meals: Start with liquid foods such as gelatin or soup. Progress to regular foods as tolerated. Avoid greasy, spicy, heavy foods. If nausea and/or vomiting occur, drink only clear liquids until the nausea and/or vomiting subsides. Call your physician if vomiting continues.  Special Instructions/Symptoms: Your throat may feel dry or sore from the anesthesia or the breathing tube placed in your throat during surgery. If this causes discomfort, gargle with warm salt water. The discomfort should disappear within 24 hours.  If you had a scopolamine patch placed behind your ear for the management of post- operative nausea and/or vomiting:  1. The medication in the patch is effective for 72 hours, after which it should be removed.  Wrap patch in a tissue and discard in the trash. Wash hands thoroughly with soap and water. 2. You may remove the patch earlier than 72 hours if you experience unpleasant side effects which may include dry mouth, dizziness or visual disturbances. 3. Avoid touching the patch. Wash your hands with soap and water after contact with the patch.    Post Anesthesia  Home Care Instructions  Activity: Get plenty of rest for the remainder of the day. A responsible individual must stay with you for 24 hours following the procedure.  For the next 24 hours, DO NOT: -Drive a car -Paediatric nurse -Drink alcoholic beverages -Take any medication unless instructed by your physician -Make any legal decisions or sign important papers.  Meals: Start with liquid foods such as gelatin or soup. Progress to regular foods as tolerated.  Avoid greasy, spicy, heavy foods. If nausea and/or vomiting occur, drink only clear liquids until the nausea and/or vomiting subsides. Call your physician if vomiting continues.  Special Instructions/Symptoms: Your throat may feel dry or sore from the anesthesia or the breathing tube placed in your throat during surgery. If this causes discomfort, gargle with warm salt water. The discomfort should disappear within 24 hours.  If you had a scopolamine patch placed behind your ear for the management of post- operative nausea and/or vomiting:  1. The medication in the patch is effective for 72 hours, after which it should be removed.  Wrap patch in a tissue and discard in the trash. Wash hands thoroughly with soap and water. 2. You may remove the patch earlier than 72 hours if you experience unpleasant side effects which may include dry mouth, dizziness or visual disturbances. 3. Avoid touching the patch. Wash your hands with soap and water after contact with the patch.    Post Anesthesia Home Care Instructions  Activity: Get plenty of rest for the remainder of the day. A responsible individual must stay with you for 24 hours following the procedure.  For the next 24 hours, DO NOT: -Drive a car -Paediatric nurse -Drink alcoholic beverages -Take any medication unless instructed by your physician -Make any legal decisions or sign important papers.  Meals: Start with liquid foods such as gelatin or soup. Progress to regular foods as tolerated. Avoid greasy, spicy, heavy foods. If nausea and/or vomiting occur, drink only clear liquids until the nausea and/or vomiting subsides. Call your physician if vomiting continues.  Special Instructions/Symptoms: Your throat may feel dry or sore from the anesthesia or the breathing tube placed in your throat during surgery. If this causes discomfort, gargle with warm salt water. The discomfort should disappear within 24 hours.  If you had a scopolamine  patch placed behind your ear for the management of post- operative nausea and/or vomiting:  1. The medication in the patch is effective for 72 hours, after which it should be removed.  Wrap patch in a tissue and discard in the trash. Wash hands thoroughly with soap and water. 2. You may remove the patch earlier than 72 hours if you experience unpleasant side effects which may include dry mouth, dizziness or visual disturbances. 3. Avoid touching the patch. Wash your hands with soap and water after contact with the patch.     Regional Anesthesia Blocks  1. Numbness or the inability to move the "blocked" extremity may last from 3-48 hours after placement. The length of time depends on the medication injected and your individual response to the medication. If the numbness is not going away after 48 hours, call your surgeon.  2. The extremity that is blocked will need to be protected until the numbness is gone and the  Strength has returned. Because you cannot feel it, you will need to take extra care to avoid injury. Because it may be weak, you may have difficulty moving it or using it. You may not know what position it  is in without looking at it while the block is in effect.  3. For blocks in the legs and feet, returning to weight bearing and walking needs to be done carefully. You will need to wait until the numbness is entirely gone and the strength has returned. You should be able to move your leg and foot normally before you try and bear weight or walk. You will need someone to be with you when you first try to ensure you do not fall and possibly risk injury.  4. Bruising and tenderness at the needle site are common side effects and will resolve in a few days.  5. Persistent numbness or new problems with movement should be communicated to the surgeon or the [place] [place] [phone] [place] [phone]).

## 2018-09-17 NOTE — Anesthesia Postprocedure Evaluation (Signed)
Anesthesia Post Note  Patient: Tammy Cooley  Procedure(s) Performed: left hallux metatarsal phalangeal joint arthrodesis and 2nd metatarsal Weil osteotomy (Left Foot)     Patient location during evaluation: PACU Anesthesia Type: General Level of consciousness: awake and alert Pain management: pain level controlled Vital Signs Assessment: post-procedure vital signs reviewed and stable Respiratory status: spontaneous breathing, nonlabored ventilation, respiratory function stable and patient connected to nasal cannula oxygen Cardiovascular status: blood pressure returned to baseline and stable Postop Assessment: no apparent nausea or vomiting Anesthetic complications: no    Last Vitals:  Vitals:   09/17/18 1200 09/17/18 1237  BP: (!) 147/97 140/83  Pulse: 69 62  Resp: 12 18  Temp:  (!) 36.1 C  SpO2: 100% 100%    Last Pain:  Vitals:   09/17/18 1237  TempSrc: Axillary  PainSc: 0-No pain                 Tiajuana Amass

## 2018-09-17 NOTE — Anesthesia Procedure Notes (Signed)
Anesthesia Regional Block: Popliteal block   Pre-Anesthetic Checklist: ,, timeout performed, Correct Patient, Correct Site, Correct Laterality, Correct Procedure, Correct Position, site marked, Risks and benefits discussed,  Surgical consent,  Pre-op evaluation,  At surgeon's request and post-op pain management  Laterality: Left  Prep: chloraprep       Needles:  Injection technique: Single-shot  Needle Type: Echogenic Needle     Needle Length: 9cm  Needle Gauge: 21     Additional Needles:   Procedures:,,,, ultrasound used (permanent image in chart),,,,  Narrative:  Start time: 09/17/2018 9:06 AM End time: 09/17/2018 9:12 AM Injection made incrementally with aspirations every 5 mL.  Performed by: Personally  Anesthesiologist: Suzette Battiest, MD

## 2018-09-17 NOTE — Transfer of Care (Signed)
Immediate Anesthesia Transfer of Care Note  Patient: Tammy Cooley  Procedure(s) Performed: left hallux metatarsal phalangeal joint arthrodesis and 2nd metatarsal Weil osteotomy (Left Foot)  Patient Location: PACU  Anesthesia Type:GA combined with regional for post-op pain  Level of Consciousness: awake, oriented and patient cooperative  Airway & Oxygen Therapy: Patient Spontanous Breathing  Post-op Assessment: Report given to RN and Post -op Vital signs reviewed and stable  Post vital signs: Reviewed and stable  Last Vitals:  Vitals Value Taken Time  BP 127/91 09/17/2018 11:15 AM  Temp    Pulse 78 09/17/2018 11:16 AM  Resp 13 09/17/2018 11:16 AM  SpO2 99 % 09/17/2018 11:16 AM  Vitals shown include unvalidated device data.  Last Pain:  Vitals:   09/17/18 0834  TempSrc: Oral  PainSc: 0-No pain         Complications: No apparent anesthesia complications

## 2018-09-17 NOTE — Anesthesia Procedure Notes (Signed)
Procedure Name: LMA Insertion Date/Time: 09/17/2018 10:14 AM Performed by: Lyndee Leo, CRNA Pre-anesthesia Checklist: Patient identified, Emergency Drugs available, Suction available and Patient being monitored Patient Re-evaluated:Patient Re-evaluated prior to induction Oxygen Delivery Method: Circle system utilized Preoxygenation: Pre-oxygenation with 100% oxygen Induction Type: IV induction Ventilation: Mask ventilation without difficulty LMA: LMA inserted LMA Size: 4.0 Number of attempts: 1 Airway Equipment and Method: Bite block Placement Confirmation: positive ETCO2 Tube secured with: Tape Dental Injury: Teeth and Oropharynx as per pre-operative assessment

## 2018-09-17 NOTE — Op Note (Addendum)
09/17/2018  11:16 AM  PATIENT:  Tammy Cooley  67 y.o. female  PRE-OPERATIVE DIAGNOSIS: 1.  Painful left foot bunion deformity 2.  Left hallux rigidus 3.  Left forefoot metatarsalgia  POST-OPERATIVE DIAGNOSIS: Same  Procedure(s): 1.  Left foot silver bunionectomy 2.  Left hallux MP joint arthrodesis 3.  Left second metatarsal Weil osteotomy 4.  Left foot AP, lateral and oblique radiographs  SURGEON:  Wylene Simmer, MD  ASSISTANT: Mechele Claude, PA-C  ANESTHESIA:   General, regional  EBL:  minimal   TOURNIQUET:   Total Tourniquet Time Documented: Thigh (Left) - 40 minutes Total: Thigh (Left) - 40 minutes  COMPLICATIONS:  None apparent  DISPOSITION:  Extubated, awake and stable to recovery.  INDICATION FOR PROCEDURE: The patient is a 67 year old female without significant past medical history.  She complains of left forefoot pain due to hallux rigidus and a painful bunion deformity.  She also has symptoms of metatarsalgia with second TMT joint arthritis.  She has failed nonoperative treatment to date including activity modification, oral anti-inflammatories and shoewear modification.  She presents today for surgical correction of these painful conditions.  The risks and benefits of the alternative treatment options have been discussed in detail.  The patient wishes to proceed with surgery and specifically understands risks of bleeding, infection, nerve damage, blood clots, need for additional surgery, amputation and death.  PROCEDURE IN DETAIL:  After pre operative consent was obtained, and the correct operative site was identified, the patient was brought to the operating room and placed supine on the OR table.  Anesthesia was administered.  Pre-operative antibiotics were administered.  A surgical timeout was taken.  The left lower extremity was prepped and draped in standard sterile fashion with a tourniquet around the thigh.  The extremity was exsanguinated and the tourniquet was  inflated to 250 mmHg.  A longitudinal incision was made over the hallux MP joint.  Dissection was carried down through the subcutaneous tissues.  The extensor hallucis longus and brevis tendons were retracted laterally.  The joint capsule was incised and elevated medially and laterally.  The collateral ligaments were released exposing the metatarsal head.  Significant arthritic changes were noticed at the metatarsal head.  The concave reamer was used to remove the remaining articular cartilage and subchondral bone.  The convex reamer was used to remove the cartilage and subchondral bone at the base of the proximal phalanx.  The wound was irrigated.  A drill bit was used to perforate both sides of the joint leaving the resultant bone graft in place.  The joint was reduced and provisionally pinned.  Radiographs confirmed appropriate alignment of the joint along with a simulated weightbearing examination of the foot.  A 4 mm partially-threaded cannulated screw was inserted across the joint and was noted to compress appropriately.  A 5 hole one quarter tubular plate from the [employer] mini frag set was placed dorsally on the joint.  It was fixed distally with 2 bicortical screws and proximally with 2 bicortical screws.  AP and lateral radiographs confirmed appropriate position of the joint and appropriate position and length of all hardware.  The dorsal joint capsule was then repaired with Vicryl.  Subcutaneous tissues were approximated with Monocryl.  The skin incision was closed with nylon.  Attention was then turned to the second MTP joint where a dorsal incision was made.  Dissection was carried down through the subcutaneous tissues.  The extensor tendons were protected, and the dorsal joint capsule was incised.  The  metatarsal head was exposed.  A Weil osteotomy was made with the oscillating saw.  The metatarsal head was allowed to retract proximally several millimeters.  The osteotomy was then fixed with a 2 mm  Biomet FRS screw.  Final AP, lateral and oblique radiographs of the foot showed appropriate position and length of all hardware and appropriate correction of the forefoot deformities.  The wound was then irrigated copiously.  Monocryl and nylon were used to close the incision.  Sterile dressings were applied followed by a cam walker boot.  Tourniquet was released after application of the dressings.  The patient was awakened from anesthesia and transported to the recovery room in stable condition.   FOLLOW UP PLAN: Weightbearing as tolerated in a cam boot on the heel.  Follow-up in 3 weeks for suture removal.    RADIOGRAPHS: AP, lateral and oblique radiographs of the left foot are obtained intraoperatively.  These show interval arthrodesis of the hallux MP joint and correction of the bunion deformity.  Second metatarsal has been shortened.  Hardware is appropriately positioned and of the appropriate lengths.    Mechele Claude PA-C was present and scrubbed for the duration of the operative case. His assistance was essential in positioning the patient, prepping and draping, gaining and maintaining exposure, performing the operation, closing and dressing the wounds and applying the splint.

## 2018-09-18 ENCOUNTER — Encounter (HOSPITAL_BASED_OUTPATIENT_CLINIC_OR_DEPARTMENT_OTHER): Payer: Self-pay | Admitting: Orthopedic Surgery

## 2018-09-25 DIAGNOSIS — H04129 Dry eye syndrome of unspecified lacrimal gland: Secondary | ICD-10-CM | POA: Diagnosis not present

## 2018-09-25 DIAGNOSIS — Z803 Family history of malignant neoplasm of breast: Secondary | ICD-10-CM | POA: Diagnosis not present

## 2018-09-25 DIAGNOSIS — J309 Allergic rhinitis, unspecified: Secondary | ICD-10-CM | POA: Diagnosis not present

## 2018-09-25 DIAGNOSIS — Z7982 Long term (current) use of aspirin: Secondary | ICD-10-CM | POA: Diagnosis not present

## 2018-09-25 DIAGNOSIS — Z79891 Long term (current) use of opiate analgesic: Secondary | ICD-10-CM | POA: Diagnosis not present

## 2018-09-25 DIAGNOSIS — R32 Unspecified urinary incontinence: Secondary | ICD-10-CM | POA: Diagnosis not present

## 2018-09-25 DIAGNOSIS — K219 Gastro-esophageal reflux disease without esophagitis: Secondary | ICD-10-CM | POA: Diagnosis not present

## 2018-09-25 DIAGNOSIS — Z791 Long term (current) use of non-steroidal anti-inflammatories (NSAID): Secondary | ICD-10-CM | POA: Diagnosis not present

## 2018-09-25 DIAGNOSIS — M1990 Unspecified osteoarthritis, unspecified site: Secondary | ICD-10-CM | POA: Diagnosis not present

## 2018-09-25 DIAGNOSIS — R269 Unspecified abnormalities of gait and mobility: Secondary | ICD-10-CM | POA: Diagnosis not present

## 2018-09-30 HISTORY — PX: FOOT SURGERY: SHX648

## 2018-10-09 DIAGNOSIS — M21619 Bunion of unspecified foot: Secondary | ICD-10-CM | POA: Insufficient documentation

## 2018-10-09 DIAGNOSIS — M7742 Metatarsalgia, left foot: Secondary | ICD-10-CM | POA: Insufficient documentation

## 2018-10-20 DIAGNOSIS — L259 Unspecified contact dermatitis, unspecified cause: Secondary | ICD-10-CM | POA: Diagnosis not present

## 2018-11-06 DIAGNOSIS — M7742 Metatarsalgia, left foot: Secondary | ICD-10-CM | POA: Diagnosis not present

## 2018-11-06 DIAGNOSIS — M2022 Hallux rigidus, left foot: Secondary | ICD-10-CM | POA: Diagnosis not present

## 2018-11-06 DIAGNOSIS — M21612 Bunion of left foot: Secondary | ICD-10-CM | POA: Diagnosis not present

## 2018-11-06 DIAGNOSIS — Z5189 Encounter for other specified aftercare: Secondary | ICD-10-CM | POA: Diagnosis not present

## 2018-11-25 DIAGNOSIS — J0100 Acute maxillary sinusitis, unspecified: Secondary | ICD-10-CM | POA: Diagnosis not present

## 2018-11-25 DIAGNOSIS — R05 Cough: Secondary | ICD-10-CM | POA: Diagnosis not present

## 2018-11-25 DIAGNOSIS — J029 Acute pharyngitis, unspecified: Secondary | ICD-10-CM | POA: Diagnosis not present

## 2018-12-04 DIAGNOSIS — M7742 Metatarsalgia, left foot: Secondary | ICD-10-CM | POA: Diagnosis not present

## 2018-12-04 DIAGNOSIS — M2022 Hallux rigidus, left foot: Secondary | ICD-10-CM | POA: Diagnosis not present

## 2018-12-04 DIAGNOSIS — Z5189 Encounter for other specified aftercare: Secondary | ICD-10-CM | POA: Diagnosis not present

## 2019-01-11 DIAGNOSIS — M79672 Pain in left foot: Secondary | ICD-10-CM | POA: Diagnosis not present

## 2019-01-22 ENCOUNTER — Other Ambulatory Visit: Payer: Self-pay | Admitting: Student

## 2019-01-22 DIAGNOSIS — M79672 Pain in left foot: Secondary | ICD-10-CM

## 2019-03-03 ENCOUNTER — Other Ambulatory Visit: Payer: Medicare HMO

## 2019-03-16 ENCOUNTER — Ambulatory Visit
Admission: RE | Admit: 2019-03-16 | Discharge: 2019-03-16 | Disposition: A | Discharge status: Home / Self Care | Payer: Medicare HMO | Source: Ambulatory Visit | Attending: Student | Admitting: Student

## 2019-03-16 ENCOUNTER — Other Ambulatory Visit: Payer: Self-pay

## 2019-03-16 DIAGNOSIS — M79672 Pain in left foot: Secondary | ICD-10-CM

## 2019-03-16 DIAGNOSIS — Z981 Arthrodesis status: Secondary | ICD-10-CM | POA: Diagnosis not present

## 2019-04-16 DIAGNOSIS — M2022 Hallux rigidus, left foot: Secondary | ICD-10-CM | POA: Diagnosis not present

## 2019-04-16 DIAGNOSIS — M21622 Bunionette of left foot: Secondary | ICD-10-CM | POA: Diagnosis not present

## 2019-04-16 DIAGNOSIS — M79672 Pain in left foot: Secondary | ICD-10-CM | POA: Diagnosis not present

## 2019-05-14 DIAGNOSIS — M79672 Pain in left foot: Secondary | ICD-10-CM | POA: Diagnosis not present

## 2019-05-20 DIAGNOSIS — L570 Actinic keratosis: Secondary | ICD-10-CM | POA: Diagnosis not present

## 2019-05-20 DIAGNOSIS — C44722 Squamous cell carcinoma of skin of right lower limb, including hip: Secondary | ICD-10-CM | POA: Diagnosis not present

## 2019-05-20 DIAGNOSIS — L308 Other specified dermatitis: Secondary | ICD-10-CM | POA: Diagnosis not present

## 2019-05-20 DIAGNOSIS — L905 Scar conditions and fibrosis of skin: Secondary | ICD-10-CM | POA: Diagnosis not present

## 2019-06-03 DIAGNOSIS — C44722 Squamous cell carcinoma of skin of right lower limb, including hip: Secondary | ICD-10-CM | POA: Diagnosis not present

## 2019-06-13 DIAGNOSIS — R69 Illness, unspecified: Secondary | ICD-10-CM | POA: Diagnosis not present

## 2019-07-28 DIAGNOSIS — Z85828 Personal history of other malignant neoplasm of skin: Secondary | ICD-10-CM | POA: Diagnosis not present

## 2019-07-28 DIAGNOSIS — L905 Scar conditions and fibrosis of skin: Secondary | ICD-10-CM | POA: Diagnosis not present

## 2019-07-28 DIAGNOSIS — L570 Actinic keratosis: Secondary | ICD-10-CM | POA: Diagnosis not present

## 2019-08-13 DIAGNOSIS — Z13220 Encounter for screening for lipoid disorders: Secondary | ICD-10-CM | POA: Diagnosis not present

## 2019-08-13 DIAGNOSIS — Z23 Encounter for immunization: Secondary | ICD-10-CM | POA: Diagnosis not present

## 2019-08-13 DIAGNOSIS — E7800 Pure hypercholesterolemia, unspecified: Secondary | ICD-10-CM | POA: Diagnosis not present

## 2019-08-13 DIAGNOSIS — R69 Illness, unspecified: Secondary | ICD-10-CM | POA: Diagnosis not present

## 2019-08-13 DIAGNOSIS — Z0000 Encounter for general adult medical examination without abnormal findings: Secondary | ICD-10-CM | POA: Diagnosis not present

## 2019-08-25 DIAGNOSIS — M170 Bilateral primary osteoarthritis of knee: Secondary | ICD-10-CM | POA: Diagnosis not present

## 2019-08-25 DIAGNOSIS — M25562 Pain in left knee: Secondary | ICD-10-CM | POA: Diagnosis not present

## 2019-08-25 DIAGNOSIS — E7800 Pure hypercholesterolemia, unspecified: Secondary | ICD-10-CM | POA: Insufficient documentation

## 2019-10-13 DIAGNOSIS — Z1231 Encounter for screening mammogram for malignant neoplasm of breast: Secondary | ICD-10-CM | POA: Diagnosis not present

## 2020-02-17 ENCOUNTER — Encounter: Payer: Self-pay | Admitting: Podiatry

## 2020-02-17 ENCOUNTER — Ambulatory Visit: Payer: Medicare HMO | Admitting: Podiatry

## 2020-02-17 ENCOUNTER — Ambulatory Visit (INDEPENDENT_AMBULATORY_CARE_PROVIDER_SITE_OTHER): Payer: Medicare HMO

## 2020-02-17 ENCOUNTER — Other Ambulatory Visit: Payer: Self-pay

## 2020-02-17 VITALS — BP 143/92 | HR 70 | Temp 97.3°F

## 2020-02-17 DIAGNOSIS — N61 Mastitis without abscess: Secondary | ICD-10-CM | POA: Insufficient documentation

## 2020-02-17 DIAGNOSIS — M778 Other enthesopathies, not elsewhere classified: Secondary | ICD-10-CM

## 2020-02-17 DIAGNOSIS — T8484XA Pain due to internal orthopedic prosthetic devices, implants and grafts, initial encounter: Secondary | ICD-10-CM | POA: Diagnosis not present

## 2020-02-17 DIAGNOSIS — M8580 Other specified disorders of bone density and structure, unspecified site: Secondary | ICD-10-CM | POA: Insufficient documentation

## 2020-02-17 DIAGNOSIS — N951 Menopausal and female climacteric states: Secondary | ICD-10-CM | POA: Insufficient documentation

## 2020-02-17 MED ORDER — METHYLPREDNISOLONE 4 MG PO TBPK
ORAL_TABLET | ORAL | 0 refills | Status: DC
Start: 2020-02-17 — End: 2020-06-13

## 2020-02-17 NOTE — Progress Notes (Signed)
Subjective:  Patient ID: Tammy Cooley, female    DOB: May 19, 1951,  MRN: [id number] HPI Chief Complaint  Patient presents with  . Foot Pain    left foot pain and swelling, especially in toes. Second left toe in particular. Patient had extensive surgery on this foot in December 2019 with Dr. Doran Durand. She has had pain and cramping ever since. Hopes that we can take out some of the hardware and get her pain under control    69 y.o. female presents with the above complaint.   ROS: Denies fever chills nausea vomiting muscle aches pains calf pain back pain chest pain shortness of breath.  Plan: Discussed etiology pathology conservative surgical therapies she had a fusion by Dr. Doran Durand of the first metatarsophalangeal joint and a second metatarsal osteotomy with screw fixation.  She states that the second and third toes are really hurting her and she has now developed a tailor's bunion deformity.  She would like to see about removing internal fixation.  Past Medical History:  Diagnosis Date  . Acne rosacea   . Back pain   . Colon polyp, hyperplastic   . Diverticulitis   . GERD (gastroesophageal reflux disease)   . History of shingles 2010  . OA (osteoarthritis) of knee    bilateral  . OP (osteoporosis)   . Seasonal allergies    Past Surgical History:  Procedure Laterality Date  . ABDOMINAL HYSTERECTOMY     total 1999  . ARTHRODESIS METATARSALPHALANGEAL JOINT (MTPJ) Left 09/17/2018   Procedure: left hallux metatarsal phalangeal joint arthrodesis and 2nd metatarsal Weil osteotomy;  Surgeon: Wylene Simmer, MD;  Location: [facility];  Service: Orthopedics;  Laterality: Left;  30min  . COLONOSCOPY W/ POLYPECTOMY    . left knee arthroscopy    . right knee arthroscopy      Current Outpatient Medications:  .  aspirin EC 81 MG tablet, Take 81 mg by mouth daily., Disp: , Rfl:  .  atorvastatin (LIPITOR) 10 MG tablet, Take 10 mg by mouth daily., Disp: , Rfl:  .  calcium carbonate  (TUMS) 500 MG chewable tablet, Chew 1 tablet by mouth daily., Disp: , Rfl:  .  cholecalciferol (VITAMIN D3) 25 MCG (1000 UT) tablet, Take 1,000 Units by mouth daily., Disp: , Rfl:  .  docusate sodium (COLACE) 100 MG capsule, Take 1 capsule (100 mg total) by mouth 2 (two) times daily. While taking narcotic pain medicine., Disp: 30 capsule, Rfl: 0 .  estradiol (ESTRACE) 1 MG tablet, estradiol 1 mg tablet  Take 1 tablet every day by oral route for 30 days., Disp: , Rfl:  .  hydrOXYzine (ATARAX/VISTARIL) 10 MG tablet, hydroxyzine HCl 10 mg tablet  TAKE 1 TABLET BY MOUTH EVERY 8 HOURS AS NEEDED, Disp: , Rfl:  .  influenza vaccine adjuvanted (FLUAD QUADRIVALENT) 0.5 ML injection, Fluad Quad 2020-2021(71yr up)(PF) 60 mcg (15 mcg x 4)/0.57mL IM syringe  INJECT 0.5ML INTRAMUSCULARLY ONCE, Disp: , Rfl:  .  lidocaine (LINDAMANTLE) 3 % CREA cream, lidocaine 3 % topical cream  Apply 1 application by topical route., Disp: , Rfl:  .  lidocaine (XYLOCAINE) 2 % solution, Lidocaine Viscous 2 % mucosal solution, Disp: , Rfl:  .  loratadine (CLARITIN) 10 MG tablet, Take 10 mg by mouth daily., Disp: , Rfl:  .  methylPREDNISolone (MEDROL DOSEPAK) 4 MG TBPK tablet, 6 day dose pack - take as directed, Disp: 21 tablet, Rfl: 0 .  mupirocin ointment (BACTROBAN) 2 %, mupirocin 2 % topical ointment,  Disp: , Rfl:  .  naproxen sodium (ALEVE) 220 MG tablet, Take 220 mg by mouth., Disp: , Rfl:  .  senna (SENOKOT) 8.6 MG TABS tablet, Take 2 tablets (17.2 mg total) by mouth 2 (two) times daily., Disp: 30 each, Rfl: 0 .  triamcinolone cream (KENALOG) 0.1 %, triamcinolone acetonide 0.1 % topical cream  APPLY CREAM EXTERNALLY TO AFFECTED AREA TWICE DAILY, Disp: , Rfl:   No Known Allergies Review of Systems Objective:   Vitals:   02/17/20 1503  BP: (!) 143/92  Pulse: 70  Temp: (!) 97.3 F (36.3 C)    General: Well developed, nourished, in no acute distress, alert and oriented x3   Dermatological: Skin is warm, dry and supple  bilateral. Nails x 10 are well maintained; remaining integument appears unremarkable at this time. There are no open sores, no preulcerative lesions, no rash or signs of infection present.  Vascular: Dorsalis Pedis artery and Posterior Tibial artery pedal pulses are 2/4 bilateral with immedate capillary fill time. Pedal hair growth present. No varicosities and no lower extremity edema present bilateral.   Neruologic: Grossly intact via light touch bilateral. Vibratory intact via tuning fork bilateral. Protective threshold with Semmes Wienstein monofilament intact to all pedal sites bilateral. Patellar and Achilles deep tendon reflexes 2+ bilateral. No Babinski or clonus noted bilateral.   Musculoskeletal: No gross boney pedal deformities bilateral. No pain, crepitus, or limitation noted with foot and ankle range of motion bilateral. Muscular strength 5/5 in all groups tested bilateral.  The majority of her pain is located over and under the second and third metatarsophalangeal joints where there is some dorsiflexion of the toes.  The scar is gone on to heal uneventfully however she has pain on attempted range of motion of these toes.  She states that he has never addressed the pain.  Gait: Unassisted, Nonantalgic.    Radiographs:  Radiographs taken today demonstrate an osseously mature individual with plate and screws and a screw crossing on the metatarsophalangeal joint first left.  She also has a screw to the second metatarsal status post second metatarsal osteotomy.  Fusion appears to be complete.  Assessment & Plan:   Assessment: Painful internal fixation first and second metatarsal phalangeal joint and second metatarsal respectively.  Also capsulitis of the second and third metatarsophalangeal joints.  Plan: Discussed etiology pathology conservative surgical therapies discussed removing internal fixation at some point.  I injected the second intermetatarsal space near the joints.  She  tolerated this procedure well without complications.  I also started her on a Medrol Dosepak and recommended stiff soled shoes.  We also discussed the possibility of further injections as well as orthotics.     Karam Dunson T. [place], [place]

## 2020-02-27 DIAGNOSIS — N390 Urinary tract infection, site not specified: Secondary | ICD-10-CM | POA: Diagnosis not present

## 2020-02-27 DIAGNOSIS — N3001 Acute cystitis with hematuria: Secondary | ICD-10-CM | POA: Diagnosis not present

## 2020-03-23 ENCOUNTER — Encounter: Payer: Self-pay | Admitting: Podiatry

## 2020-03-23 ENCOUNTER — Other Ambulatory Visit: Payer: Self-pay

## 2020-03-23 ENCOUNTER — Ambulatory Visit: Payer: Medicare HMO | Admitting: Podiatry

## 2020-03-23 DIAGNOSIS — M778 Other enthesopathies, not elsewhere classified: Secondary | ICD-10-CM | POA: Diagnosis not present

## 2020-03-23 DIAGNOSIS — T8484XA Pain due to internal orthopedic prosthetic devices, implants and grafts, initial encounter: Secondary | ICD-10-CM

## 2020-03-26 NOTE — Progress Notes (Signed)
She presents today for follow-up of her capsulitis states that she still having a lot of cramps and pain in her feet at nighttime particularly with walking she states that it does wake her up cortisone injections really did not help much.  These were osteotomies that were done by Dr. Doran Durand and she states it just seems to be getting worse.  Objective: Vital signs are stable alert oriented x3.  Pulses are palpable.  No change in physical exam.  Assessment: Pain in second and third metatarsals with hammertoe deformity secondary to previous surgery.  Possible internal fixation pain.  Plan: At this point I am going to get her to get the notes for me so that we could consider removing internal fixation and straightening those toes a little better.

## 2020-04-06 ENCOUNTER — Telehealth: Payer: Self-pay | Admitting: Podiatry

## 2020-04-06 NOTE — Telephone Encounter (Signed)
NO I NEVER RECEIVED IT.

## 2020-04-06 NOTE — Telephone Encounter (Signed)
Did you?  

## 2020-04-06 NOTE — Telephone Encounter (Signed)
Pt called and wanted to know if Dr.Hyatt received the operative report in the mail please assist

## 2020-04-10 ENCOUNTER — Telehealth: Payer: Self-pay | Admitting: *Deleted

## 2020-04-10 NOTE — Telephone Encounter (Signed)
Pt called to see if op note had been faxed to Dr. Milinda Pointer.

## 2020-04-10 NOTE — Telephone Encounter (Signed)
I reviewed Chart Review Surgeries and 09/17/2018 surgery performed by Dr. Doran Durand was available. I asked pt if that was the surgery she had wanted Dr. Milinda Pointer to review and she stated it was. I printed to be given to Dr. Milinda Pointer 04/11/2020.

## 2020-04-13 ENCOUNTER — Encounter: Payer: Self-pay | Admitting: Podiatry

## 2020-06-11 DIAGNOSIS — R69 Illness, unspecified: Secondary | ICD-10-CM | POA: Diagnosis not present

## 2020-06-13 ENCOUNTER — Ambulatory Visit: Payer: Medicare HMO | Admitting: Podiatry

## 2020-06-13 ENCOUNTER — Other Ambulatory Visit: Payer: Self-pay

## 2020-06-13 ENCOUNTER — Encounter: Payer: Self-pay | Admitting: Podiatry

## 2020-06-13 DIAGNOSIS — M21962 Unspecified acquired deformity of left lower leg: Secondary | ICD-10-CM | POA: Diagnosis not present

## 2020-06-13 DIAGNOSIS — T8484XA Pain due to internal orthopedic prosthetic devices, implants and grafts, initial encounter: Secondary | ICD-10-CM | POA: Diagnosis not present

## 2020-06-13 DIAGNOSIS — M24575 Contracture, left foot: Secondary | ICD-10-CM

## 2020-06-14 NOTE — Progress Notes (Signed)
She presents today to discuss surgery once again.  She states that her foot is really not getting any better.  She had a fusion of the first metatarsophalangeal joint by one of the local orthopedic foot fellows and a second metatarsal osteotomy which has resulted in chronic inflammation.  She states that she wished she had never had it done.  She states that she is ready to get the fixation out of the second toe as she refers to the second metatarsal and to see if there is anything we can do about the hammertoe deformity that has resulted from the procedure.  Objective: Vital signs are stable alert and oriented x3.  Pulses remain palpable in the left foot.  Good fusion of the first metatarsophalangeal joints present no pain on palpation of the internal fixation there.  She does have significant pain however on palpation of the second and third metatarsal heads.  She has mild hammertoe deformities and contracted digits at that level.  They are relatively flexible but painful on attempted plantar flexion.  Reviewed right previous radiographs demonstrating internal fixation consisting of a Biomet screw 2 oh second metatarsal.  Slightly elongated second and third metatarsals was some osteoarthritic changes at the level of the second metatarsophalangeal joint.  Most likely secondary to the procedure.  Assessment: Painful capsulitis internal fixation forefoot left.  Hammertoe deformity 2-3 left.  Plan: Discussed etiology pathology conservative versus surgical therapies.  At this point we will remove the internal fixation of the second metatarsal lengthening the capsule and the tendons to see if we can get the toes to sit down without having to perform other second and third met osteotomies she understands this and is amenable to it.  We consented her for this today discussing the possible postop complications which may include but not limited to postop pain bleeding swelling infection recurrence worsening of the  pain.  She understands that she may have external K wires toes 2 and 3 of the left foot.  I will follow-up with her in the near future for surgical intervention we did provide her with: Information regarding the surgery center anesthesia group and provided her with a new cam walker.

## 2020-06-26 ENCOUNTER — Telehealth: Payer: Self-pay

## 2020-06-26 NOTE — Telephone Encounter (Signed)
DOS 07/07/2020  METATARSAL OSTEOTOMY 3RD LT - 28308 REMOVAL FIXATION DEEP 2ND LT - 20680 CAPSULOTOMY, MPJ RELEASE JOINT 2/3 LT - 28270  [employer] MEDICARE EFFECTIVE DATE - 03/30/2018  PLAN DEDUCTIBLE - $0.00 OUT OF POCKET - $5000.00 W/ $4461.90 REMAINING COPAY $200.00 COINSURANCE - 100%  PER AUTOMATED SYSTEM NO PRECERT REQUIRED FOR CPT 28308, [address]380 OR [address]  CALL REF # [id number]

## 2020-07-05 ENCOUNTER — Other Ambulatory Visit: Payer: Self-pay | Admitting: Podiatry

## 2020-07-05 MED ORDER — ONDANSETRON HCL 4 MG PO TABS: 4.0000 mg | ORAL_TABLET | Freq: Three times a day (TID) | ORAL | 0 refills | Status: DC | PRN

## 2020-07-05 MED ORDER — OXYCODONE-ACETAMINOPHEN 10-325 MG PO TABS: 1.0000 | ORAL_TABLET | Freq: Three times a day (TID) | ORAL | 0 refills | Status: AC | PRN

## 2020-07-05 MED ORDER — CEPHALEXIN 500 MG PO CAPS
500.0000 mg | ORAL_CAPSULE | Freq: Three times a day (TID) | ORAL | 0 refills | Status: DC
Start: 2020-07-05 — End: 2020-10-19

## 2020-07-07 DIAGNOSIS — T8484XA Pain due to internal orthopedic prosthetic devices, implants and grafts, initial encounter: Secondary | ICD-10-CM | POA: Diagnosis not present

## 2020-07-07 DIAGNOSIS — M24575 Contracture, left foot: Secondary | ICD-10-CM | POA: Diagnosis not present

## 2020-07-07 DIAGNOSIS — M21962 Unspecified acquired deformity of left lower leg: Secondary | ICD-10-CM | POA: Diagnosis not present

## 2020-07-07 DIAGNOSIS — M25572 Pain in left ankle and joints of left foot: Secondary | ICD-10-CM | POA: Diagnosis not present

## 2020-07-07 DIAGNOSIS — Z4889 Encounter for other specified surgical aftercare: Secondary | ICD-10-CM | POA: Diagnosis not present

## 2020-07-13 ENCOUNTER — Ambulatory Visit (INDEPENDENT_AMBULATORY_CARE_PROVIDER_SITE_OTHER): Payer: Medicare HMO | Admitting: Podiatry

## 2020-07-13 ENCOUNTER — Encounter: Payer: Self-pay | Admitting: Podiatry

## 2020-07-13 ENCOUNTER — Ambulatory Visit (INDEPENDENT_AMBULATORY_CARE_PROVIDER_SITE_OTHER): Payer: Medicare HMO

## 2020-07-13 ENCOUNTER — Other Ambulatory Visit: Payer: Self-pay

## 2020-07-13 DIAGNOSIS — Z9889 Other specified postprocedural states: Secondary | ICD-10-CM

## 2020-07-13 DIAGNOSIS — M21962 Unspecified acquired deformity of left lower leg: Secondary | ICD-10-CM

## 2020-07-13 DIAGNOSIS — M24575 Contracture, left foot: Secondary | ICD-10-CM

## 2020-07-13 DIAGNOSIS — T8484XA Pain due to internal orthopedic prosthetic devices, implants and grafts, initial encounter: Secondary | ICD-10-CM

## 2020-07-13 NOTE — Progress Notes (Signed)
She presents today for her first postop visit date of surgery is 07/07/2020 status post release metatarsophalangeal joints and pinning of the toes. With removal of internal fixation.  Objective: Dry sterile dressing intact there is no erythema edema cellulitis drainage odor K wires are intact. Radiographs demonstrate a well-healing well-placed K wires no loosening.  Assessment: Well-healing surgical foot 1 week.  Plan: I redressed today dressed compressive dressing encouraged to continue use of the cam walker follow-up with me in 1 to 2 weeks for suture removal.

## 2020-07-25 ENCOUNTER — Ambulatory Visit (INDEPENDENT_AMBULATORY_CARE_PROVIDER_SITE_OTHER): Payer: Medicare HMO | Admitting: Podiatry

## 2020-07-25 ENCOUNTER — Other Ambulatory Visit: Payer: Self-pay

## 2020-07-25 ENCOUNTER — Encounter: Payer: Self-pay | Admitting: Podiatry

## 2020-07-25 DIAGNOSIS — T8484XA Pain due to internal orthopedic prosthetic devices, implants and grafts, initial encounter: Secondary | ICD-10-CM

## 2020-07-25 DIAGNOSIS — M21962 Unspecified acquired deformity of left lower leg: Secondary | ICD-10-CM

## 2020-07-25 DIAGNOSIS — Z9889 Other specified postprocedural states: Secondary | ICD-10-CM

## 2020-07-25 NOTE — Progress Notes (Signed)
She presents today for her second postop visit date of surgery is 07/07/2020 metatarsal osteotomy third left removal painful internal fixation with pinning of toes #2 #3 of the left foot.  She states that is been a little bit sore stinging recently but she states that been on it more than she probably should have.  She denies fever chills nausea vomiting muscle aches pains calf pain back pain chest pain shortness of breath.  Objective: Sterile dressing was removed demonstrates no erythematous mild edema no cellulitis drainage or odor K wires are intact.  She also has sutures are intact margins appear to be well coapted there is no signs of infection.  Assessment: Well-healing surgical foot left.  Plan: Sutures were removed today margins remain well coapted.  Placed a dressing on her today and will let her start changing this she will continue the use of her Darco shoe and I will follow-up with her in about 3 weeks

## 2020-07-26 DIAGNOSIS — L718 Other rosacea: Secondary | ICD-10-CM | POA: Diagnosis not present

## 2020-07-31 DIAGNOSIS — Z01419 Encounter for gynecological examination (general) (routine) without abnormal findings: Secondary | ICD-10-CM | POA: Diagnosis not present

## 2020-08-08 ENCOUNTER — Ambulatory Visit (INDEPENDENT_AMBULATORY_CARE_PROVIDER_SITE_OTHER): Payer: Medicare HMO

## 2020-08-08 ENCOUNTER — Ambulatory Visit (INDEPENDENT_AMBULATORY_CARE_PROVIDER_SITE_OTHER): Payer: Medicare HMO | Admitting: Podiatry

## 2020-08-08 ENCOUNTER — Other Ambulatory Visit: Payer: Self-pay

## 2020-08-08 ENCOUNTER — Encounter: Payer: Self-pay | Admitting: Podiatry

## 2020-08-08 DIAGNOSIS — Z9889 Other specified postprocedural states: Secondary | ICD-10-CM

## 2020-08-08 DIAGNOSIS — M21962 Unspecified acquired deformity of left lower leg: Secondary | ICD-10-CM

## 2020-08-08 DIAGNOSIS — T8484XA Pain due to internal orthopedic prosthetic devices, implants and grafts, initial encounter: Secondary | ICD-10-CM | POA: Diagnosis not present

## 2020-08-08 NOTE — Progress Notes (Signed)
She presents today for postop visit date of surgery is 07/07/2020 metatarsal osteotomy removal internal fixation.  Pinning of the talus.  She presents with her hands intact there is no bleeding she states that she has had some sharp pains recently.  Objective: Vital signs are stable she is alert oriented x3.  There is no erythema cellulitis drainage or odor.  Hands are intact I went ahead and remove those today toes remain rectus in position.  Assessment well-healing surgical foot left.  Plan: Remove the pins today and dispensed a Darco splint to hold the toes down at nighttime.  I like to follow-up with her in about 2 to 3 weeks just to make sure she is doing well.

## 2020-08-10 ENCOUNTER — Other Ambulatory Visit: Payer: Self-pay | Admitting: Obstetrics and Gynecology

## 2020-08-10 DIAGNOSIS — M816 Localized osteoporosis [Lequesne]: Secondary | ICD-10-CM

## 2020-08-22 ENCOUNTER — Encounter: Payer: Medicare HMO | Admitting: Podiatry

## 2020-08-29 ENCOUNTER — Ambulatory Visit (INDEPENDENT_AMBULATORY_CARE_PROVIDER_SITE_OTHER): Payer: Medicare HMO

## 2020-08-29 ENCOUNTER — Ambulatory Visit (INDEPENDENT_AMBULATORY_CARE_PROVIDER_SITE_OTHER): Payer: Medicare HMO | Admitting: Podiatry

## 2020-08-29 ENCOUNTER — Other Ambulatory Visit: Payer: Self-pay

## 2020-08-29 ENCOUNTER — Encounter: Payer: Self-pay | Admitting: Podiatry

## 2020-08-29 DIAGNOSIS — M21962 Unspecified acquired deformity of left lower leg: Secondary | ICD-10-CM

## 2020-08-29 DIAGNOSIS — T8484XA Pain due to internal orthopedic prosthetic devices, implants and grafts, initial encounter: Secondary | ICD-10-CM | POA: Diagnosis not present

## 2020-08-29 DIAGNOSIS — Z9889 Other specified postprocedural states: Secondary | ICD-10-CM

## 2020-08-29 NOTE — Progress Notes (Signed)
She presents today for postop visit date of surgery 07/07/2020 metatarsal phalangeal joint release #2 and 3 with removal internal fixation.  She states that the fourth toe feels like it is pushing down and looks like the fifth toe is starting to pull up a little bit.  She states is still really hard to walk without fusion of the first metatarsophalangeal joint from previous surgery.  Still has some swelling but no calf pain.  Objective: Vital signs are stable alert oriented x3.  Pulses are palpable.  Still has some soft tissue swelling of the forefoot left.  She has tenderness on palpation of the forefoot but otherwise is doing quite well radiographically appears to be healing just fine.  Assessment: Well-healing surgical foot.  Plan: Placed her in a compression anklet stay in your regular shoes try to get back to regular gait is much as possible.  We will reassess the fourth toe at some point to see if it needs a flexor tenotomy and the fifth toe to see if it needs an extensor tenotomy.  I will see her in 1 month

## 2020-08-30 ENCOUNTER — Ambulatory Visit
Admission: RE | Admit: 2020-08-30 | Discharge: 2020-08-30 | Disposition: A | Discharge status: Home / Self Care | Payer: Medicare HMO | Source: Ambulatory Visit | Attending: Obstetrics and Gynecology | Admitting: Obstetrics and Gynecology

## 2020-08-30 DIAGNOSIS — M816 Localized osteoporosis [Lequesne]: Secondary | ICD-10-CM

## 2020-08-30 DIAGNOSIS — M8589 Other specified disorders of bone density and structure, multiple sites: Secondary | ICD-10-CM | POA: Diagnosis not present

## 2020-08-30 DIAGNOSIS — Z780 Asymptomatic menopausal state: Secondary | ICD-10-CM | POA: Diagnosis not present

## 2020-09-04 DIAGNOSIS — E7800 Pure hypercholesterolemia, unspecified: Secondary | ICD-10-CM | POA: Diagnosis not present

## 2020-09-04 DIAGNOSIS — R21 Rash and other nonspecific skin eruption: Secondary | ICD-10-CM | POA: Diagnosis not present

## 2020-09-04 DIAGNOSIS — Z23 Encounter for immunization: Secondary | ICD-10-CM | POA: Diagnosis not present

## 2020-09-04 DIAGNOSIS — Z0000 Encounter for general adult medical examination without abnormal findings: Secondary | ICD-10-CM | POA: Diagnosis not present

## 2020-09-19 ENCOUNTER — Ambulatory Visit: Payer: Medicare HMO

## 2020-09-19 ENCOUNTER — Encounter: Payer: Medicare HMO | Admitting: Podiatry

## 2020-09-26 ENCOUNTER — Encounter: Payer: Medicare HMO | Admitting: Podiatry

## 2020-09-28 NOTE — Progress Notes (Signed)
This encounter was created in error - please disregard.

## 2020-09-30 DIAGNOSIS — U071 COVID-19: Secondary | ICD-10-CM

## 2020-09-30 DIAGNOSIS — K5792 Diverticulitis of intestine, part unspecified, without perforation or abscess without bleeding: Secondary | ICD-10-CM

## 2020-09-30 HISTORY — DX: COVID-19: U07.1

## 2020-09-30 HISTORY — DX: Diverticulitis of intestine, part unspecified, without perforation or abscess without bleeding: K57.92

## 2020-10-19 ENCOUNTER — Ambulatory Visit (INDEPENDENT_AMBULATORY_CARE_PROVIDER_SITE_OTHER): Payer: Medicare HMO

## 2020-10-19 ENCOUNTER — Other Ambulatory Visit: Payer: Self-pay | Admitting: Podiatry

## 2020-10-19 ENCOUNTER — Ambulatory Visit (INDEPENDENT_AMBULATORY_CARE_PROVIDER_SITE_OTHER): Payer: Medicare HMO | Admitting: Podiatry

## 2020-10-19 ENCOUNTER — Encounter: Payer: Self-pay | Admitting: Podiatry

## 2020-10-19 ENCOUNTER — Other Ambulatory Visit: Payer: Self-pay

## 2020-10-19 DIAGNOSIS — T8484XA Pain due to internal orthopedic prosthetic devices, implants and grafts, initial encounter: Secondary | ICD-10-CM | POA: Diagnosis not present

## 2020-10-19 DIAGNOSIS — M21962 Unspecified acquired deformity of left lower leg: Secondary | ICD-10-CM

## 2020-10-19 DIAGNOSIS — Z9889 Other specified postprocedural states: Secondary | ICD-10-CM

## 2020-10-19 NOTE — Progress Notes (Signed)
She presents today for postop visit date of surgery 07/07/2020 removal internal fixation second metatarsal and a release of the third metatarsophalangeal joint.  She states that really everything is doing great feels so much better than it did prior to surgery she is just concerned about the fifth toe possibly overlapping the fourth toe.  Objective: Vital signs are stable she alert and oriented x3 there is no erythema edema cellulitis drainage or odor she has great range of motion her second third and fourth metatarsophalangeal joints are loose and says she has a bit of plantar flexion of those toes though she does have dorsiflexor capability is fairly weak.  But it is stronger than it was previously.  The fifth toe does slightly dorsiflex at the metatarsophalangeal joint compared to the rest of the toes  Assessment: Well-healing surgical foot dorsiflexed fifth digit left  Plan: Discussed etiology pathology and surgical therapies at this point time I think in order to get all the toes on the same level so there is opposition to medial lateral deviation I think he extensor tenotomy of the fifth slip would be the best.  So we will going get her scheduled back in the near future for 1/5 digit extensor tenotomy.  I explained to her that she will be able to get back to work almost immediately and that she would just have her Darco shoe which she will bring with her on for for 5 days.

## 2020-10-26 ENCOUNTER — Ambulatory Visit: Payer: Medicare HMO | Admitting: Podiatry

## 2020-10-26 ENCOUNTER — Other Ambulatory Visit: Payer: Self-pay

## 2020-10-26 ENCOUNTER — Encounter: Payer: Self-pay | Admitting: Podiatry

## 2020-10-26 DIAGNOSIS — M24575 Contracture, left foot: Secondary | ICD-10-CM | POA: Diagnosis not present

## 2020-10-26 NOTE — Patient Instructions (Signed)
Leave bandage in place and dry for 4 days, then remove. You may wash foot normally after removal of bandage. DO NOT SOAK FOOT! Dry completely afterwards and may use a bandaid over incision if needed. We will follow up with you in 1 weeks for recheck.  

## 2020-10-27 NOTE — Progress Notes (Signed)
She presents today for a chief complaint of painful hammertoe of the fifth digit left foot.  She states that the fifth toe is overlapping fourth toe.  Objective: Vital signs stable oriented x3.  Pulses are palpable.  Indeed she does have contraction of the fifth metatarsal phalangeal joint of the left foot with medial dislocation of the toe due to the lack of opposition.  Pulses remain palpable no open lesions or wounds.  Assessment: Contracted fifth metatarsophalangeal joint left foot.  Plan: Discussed etiology pathology conservative therapy at this point time went ahead and consented her for a extensor tenotomy.  She tolerated this well after local anesthetic was administered.  The skin was closed with glue from a small stab incision overlying the fifth extensor tendon.  Dressed a compressive dressing was applied after Betadine was applied.  Follow-up with her in 1 week Darco shoe was dispensed.

## 2020-11-02 ENCOUNTER — Ambulatory Visit: Payer: Medicare HMO | Admitting: Podiatry

## 2020-11-14 ENCOUNTER — Ambulatory Visit (INDEPENDENT_AMBULATORY_CARE_PROVIDER_SITE_OTHER): Payer: Medicare HMO | Admitting: Podiatry

## 2020-11-14 ENCOUNTER — Encounter: Payer: Self-pay | Admitting: Podiatry

## 2020-11-14 ENCOUNTER — Other Ambulatory Visit: Payer: Self-pay

## 2020-11-14 DIAGNOSIS — Z9889 Other specified postprocedural states: Secondary | ICD-10-CM

## 2020-11-14 DIAGNOSIS — M24575 Contracture, left foot: Secondary | ICD-10-CM

## 2020-11-14 NOTE — Progress Notes (Signed)
She presents today for follow-up of her tenotomy fifth digit left foot.  States is doing fine some days it just swells a little bit.  Objective: Vital signs are stable she alert oriented x3 foot appears to be rectus and in good position no reproducible pain on palpation.  Mild pitting edema.  She does have a history of venous insufficiency to her left lower extremity.  Assessment: Well-healing extensor tenotomy fifth metatarsophalangeal joint area.  Plan: Follow-up with her on an as-needed basis may does need to discuss compression hose with her at some point.

## 2021-01-15 DIAGNOSIS — Z1231 Encounter for screening mammogram for malignant neoplasm of breast: Secondary | ICD-10-CM | POA: Diagnosis not present

## 2021-01-15 IMAGING — CT CT OF THE LEFT FOOT WITHOUT CONTRAST
2 of 6 series · 7 of 27 positions shown, 9 images · non-contrast
Comparison: None.

CLINICAL DATA: Left foot pain, prior bunionectomy and fusion in
August 2018. Pain since surgery.

EXAM:
CT OF THE LEFT FOOT WITHOUT CONTRAST
TECHNIQUE: Multidetector CT imaging of the left foot was performed according to
the standard protocol. Multiplanar CT image reconstructions were
also generated.

[Series 10: sagsoft tissue · sagittal · 0.29mm/px · 5 of 48 slices shown, 6 images]
[im 16/48  bone]
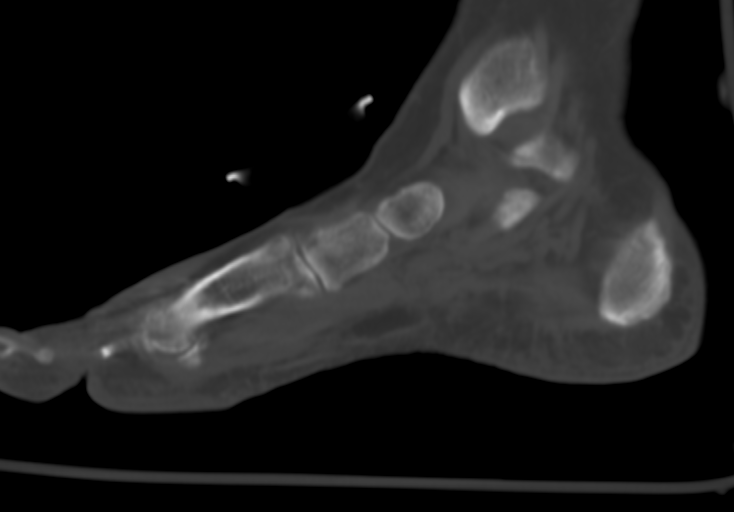
[im 20/48  bone]
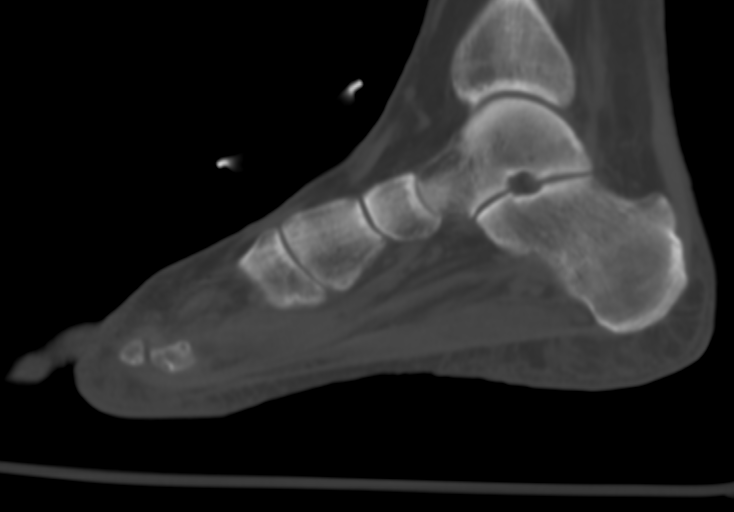
[im 24/48  soft-tissue]
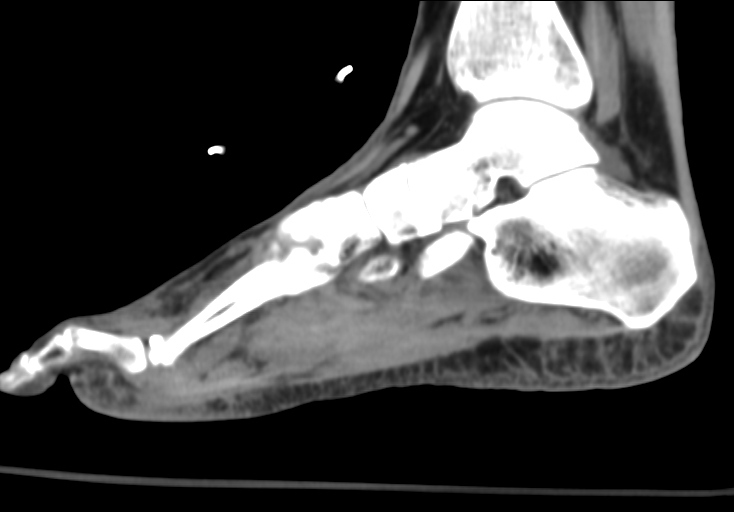
[im 24/48  bone]
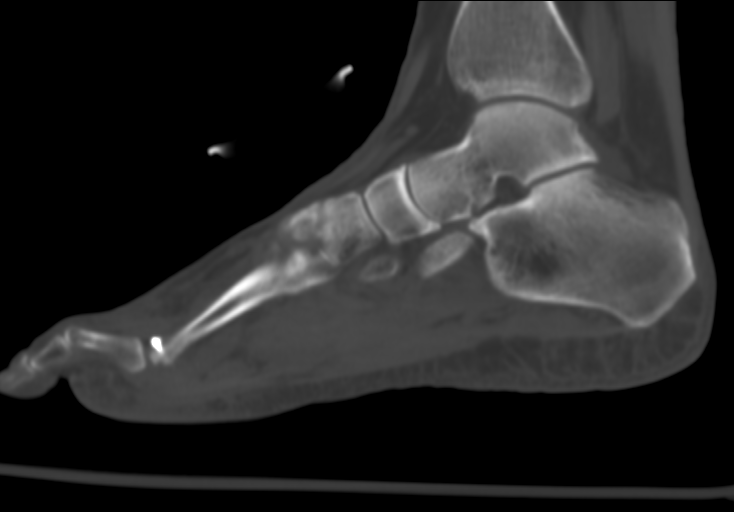
[im 28/48  bone]
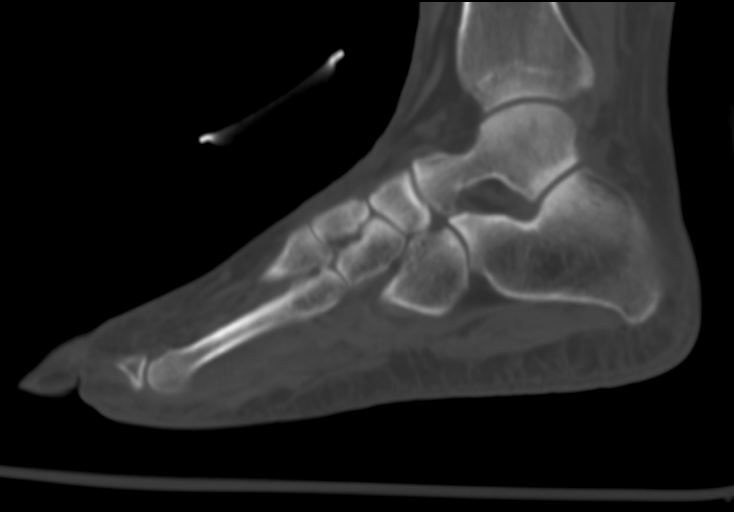
[im 32/48  bone]
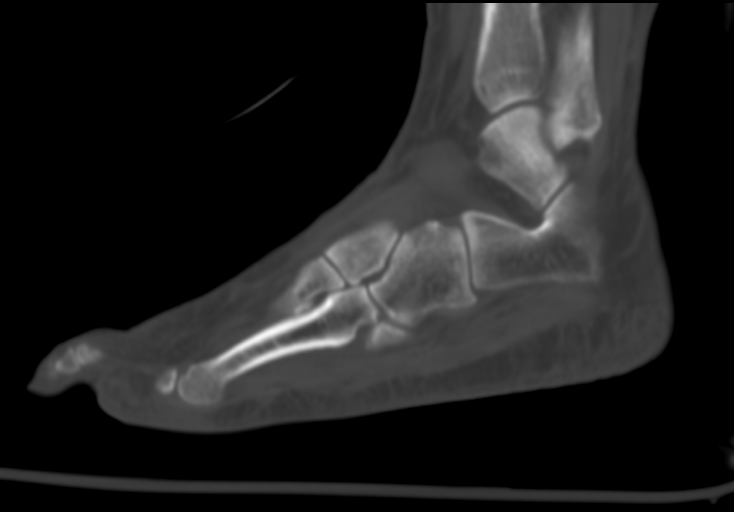

[Series 602: coronals bones · axial · 0.48mm/px · z∈[-234,-176]mm · 2 of 64 slices shown, 3 images]
[im 1/64  soft-tissue]
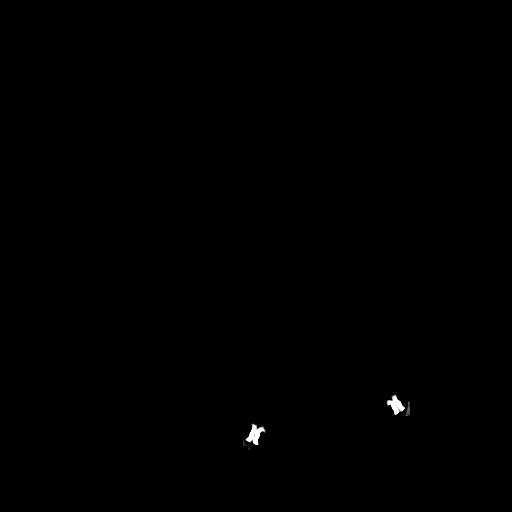
[im 1/64  bone]
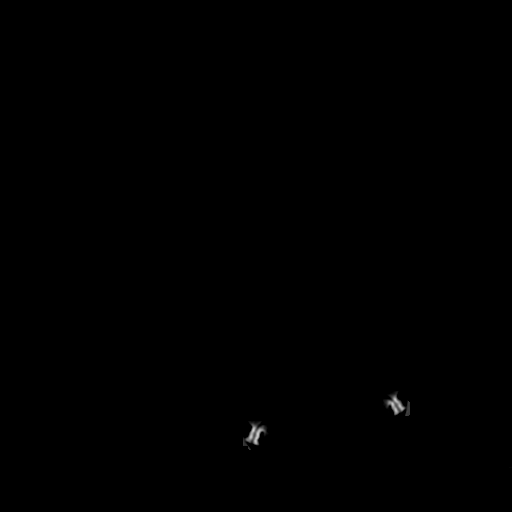
[im 32/64  bone]
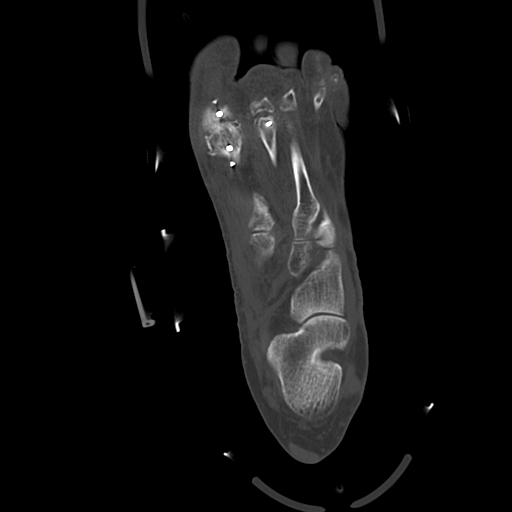

[7 of 27 positions shown; findings below may reference images not displayed]

FINDINGS: Bones/Joint/Cartilage

No fracture or dislocation. Normal alignment. No joint effusion.

Prior left metatarsal neck osteotomy with interval healing.
Arthrodesis of the first MTP joint transfixed with a dorsal side
plate and cannulated screw grew with solid osseous bridging across
the joint space.

Mild osteoarthritis of the first IP joint. Osteoarthritis of the
medial and lateral hallux sesamoid-metatarsal head articulation.
Mild osteoarthritis of the first TMT joint. Ankle mortise is intact.

Ligaments

Ligaments are suboptimally evaluated by CT.

Muscles and Tendons
Muscles are normal.  No muscle atrophy.

Soft tissue
No fluid collection or hematoma.  No soft tissue mass.
IMPRESSION: 1. Healed first metatarsal neck osteotomy. Arthrodesis of the first
MTP joint with solid osseous bridging. No hardware failure or
complication.
2.  No acute osseous injury of the left foot.

## 2021-03-21 DIAGNOSIS — M25562 Pain in left knee: Secondary | ICD-10-CM | POA: Diagnosis not present

## 2021-03-26 DIAGNOSIS — Z9013 Acquired absence of bilateral breasts and nipples: Secondary | ICD-10-CM | POA: Diagnosis not present

## 2021-03-26 DIAGNOSIS — Z79899 Other long term (current) drug therapy: Secondary | ICD-10-CM | POA: Diagnosis not present

## 2021-03-26 DIAGNOSIS — Z853 Personal history of malignant neoplasm of breast: Secondary | ICD-10-CM | POA: Diagnosis not present

## 2021-03-26 DIAGNOSIS — M8589 Other specified disorders of bone density and structure, multiple sites: Secondary | ICD-10-CM | POA: Diagnosis not present

## 2021-03-26 DIAGNOSIS — Z08 Encounter for follow-up examination after completed treatment for malignant neoplasm: Secondary | ICD-10-CM | POA: Diagnosis not present

## 2021-04-18 ENCOUNTER — Encounter: Payer: Self-pay | Admitting: Gastroenterology

## 2021-05-17 ENCOUNTER — Telehealth: Payer: Self-pay | Admitting: Gastroenterology

## 2021-05-17 NOTE — Telephone Encounter (Signed)
Pt returned your call from yesterday about her upcoming appt. Pls call her again.

## 2021-05-18 NOTE — Telephone Encounter (Signed)
Called and left detailed message for patient to stop by and sign a release so we can get records of last EGD and Colonoscopy that Dr. Earlean Shawl did before her appointment with Dr. Havery Moros on 9-14

## 2021-05-21 NOTE — Telephone Encounter (Signed)
Fyi, Pt called inquiring if we could send her release form. I faxed it over to the fax number that she gave me. [phone].

## 2021-06-13 ENCOUNTER — Other Ambulatory Visit (INDEPENDENT_AMBULATORY_CARE_PROVIDER_SITE_OTHER): Payer: Medicare HMO

## 2021-06-13 ENCOUNTER — Encounter: Payer: Self-pay | Admitting: Gastroenterology

## 2021-06-13 ENCOUNTER — Ambulatory Visit: Payer: Medicare HMO | Admitting: Gastroenterology

## 2021-06-13 VITALS — BP 146/90 | HR 80 | Ht 61.0 in | Wt 145.0 lb

## 2021-06-13 DIAGNOSIS — R159 Full incontinence of feces: Secondary | ICD-10-CM

## 2021-06-13 DIAGNOSIS — R1030 Lower abdominal pain, unspecified: Secondary | ICD-10-CM

## 2021-06-13 DIAGNOSIS — R1310 Dysphagia, unspecified: Secondary | ICD-10-CM

## 2021-06-13 DIAGNOSIS — K219 Gastro-esophageal reflux disease without esophagitis: Secondary | ICD-10-CM

## 2021-06-13 DIAGNOSIS — K529 Noninfective gastroenteritis and colitis, unspecified: Secondary | ICD-10-CM

## 2021-06-13 LAB — CBC WITH DIFFERENTIAL/PLATELET
Basophils Absolute: 0 10*3/uL (ref 0.0–0.1)
Basophils Relative: 0.8 % (ref 0.0–3.0)
Eosinophils Absolute: 0.1 10*3/uL (ref 0.0–0.7)
Eosinophils Relative: 2.7 % (ref 0.0–5.0)
HCT: 41.4 % (ref 36.0–46.0)
Hemoglobin: 13.8 g/dL (ref 12.0–15.0)
Lymphocytes Relative: 23.5 % (ref 12.0–46.0)
Lymphs Abs: 1.2 10*3/uL (ref 0.7–4.0)
MCHC: 33.4 g/dL (ref 30.0–36.0)
MCV: 94.4 fl (ref 78.0–100.0)
Monocytes Absolute: 0.5 10*3/uL (ref 0.1–1.0)
Monocytes Relative: 10.6 % (ref 3.0–12.0)
Neutro Abs: 3.1 10*3/uL (ref 1.4–7.7)
Neutrophils Relative %: 62.4 % (ref 43.0–77.0)
Platelets: 193 10*3/uL (ref 150.0–400.0)
RBC: 4.39 Mil/uL (ref 3.87–5.11)
RDW: 12.5 % (ref 11.5–15.5)
WBC: 5 10*3/uL (ref 4.0–10.5)

## 2021-06-13 LAB — TSH: TSH: 2.37 u[IU]/mL (ref 0.35–5.50)

## 2021-06-13 MED ORDER — SUTAB 1479-225-188 MG PO TABS: 1.0000 | ORAL_TABLET | Freq: Once | ORAL | 0 refills | Status: AC

## 2021-06-13 MED ORDER — HYOSCYAMINE SULFATE 0.125 MG SL SUBL: 0.1250 mg | SUBLINGUAL_TABLET | Freq: Four times a day (QID) | SUBLINGUAL | 1 refills | Status: DC | PRN

## 2021-06-13 MED ORDER — OMEPRAZOLE 40 MG PO CPDR: 40.0000 mg | DELAYED_RELEASE_CAPSULE | Freq: Every day | ORAL | 1 refills | Status: DC

## 2021-06-13 MED ORDER — COLESTIPOL HCL 1 G PO TABS: 2.0000 g | ORAL_TABLET | Freq: Two times a day (BID) | ORAL | 1 refills | Status: DC

## 2021-06-13 MED ORDER — ONDANSETRON 4 MG PO TBDP: 4.0000 mg | ORAL_TABLET | Freq: Four times a day (QID) | ORAL | 1 refills | Status: DC | PRN

## 2021-06-13 NOTE — Progress Notes (Signed)
HPI :  70 year old female with a reported history of GERD, IBS, esophageal stricture, previously followed by Dr. Earlean Shawl, here to establish as a new patient for altered bowel habits, dysphagia.  She states she has been having "stomach problems" since she has been a teenager.  She states her father was an alcoholic and there was a lot of stress in her house growing up and she felt like this affected her stomach quite a bit.  She has been told she has "a nervous stomach" in the past.  She was treated for diarrhea as a child with some medications which she states helped her.  Over the years her bowel symptoms have persisted and she thinks have gotten worse with time.  She is currently averaging 5-7 bowel movements per day on average.  Her stools are typically loose and watery, associated with a lot of gas.  Her stools can be dark but they are not black.  She does not see any obvious blood in her stool.  She does have abdominal cramping and pains that can come and go, typically in her lower abdomen, associated with bowel movements.  She has some urgency with bowel movements which can be associated with pains.  If she has significant urgency such as being in a car or out of her house she has had a rare accident.  She has had perhaps 4 episodes of incontinence over the past year.  She can have symptoms that bother her at night, she can sometimes feel nauseated and feels flushed and sweaty when she has cramping associated with this.  Sometimes can get nauseated associated with vomiting bile.  She states in the past she used to pass out to have syncope with these episodes but that has not happened in recent years.  She states that she controls her breathing that will not occur.  She has no baseline cardiopulmonary symptoms that bother her.  She does not drink alcohol or use tobacco.  She states she has abdominal pain with about 90% of her bowel movements that can be relieved with having a bowel movement.  She  states she has had a history of reflux.  Her father had Barrett's esophagus and states he had dysphagia that led to dilations in the past.  She has been having some solid food dysphagia over time, ongoing for some years.  States she feels like a spasm in her throat at times where food can get stuck.  She does not have dysphagia to liquids.  She can have regurgitation of food and vomiting at times after eating.  Her symptoms are typically worse with eating chicken or steak.  She does have heartburn that bothers her frequently and she states affects her voice and also causes coughing.  She has used Rolaids at times and on a rare Prilosec OTC but does not really take much of anything for this.  She had an EGD and 2018 with Dr. Earlean Shawl which showed a suspected distal esophageal stricture that was dilated to 18 mm in suspicion for EOE but biopsies were negative for that.  In general she has not been taking much of anything for her symptoms recently.  She states she does respond to Imodium when she takes it but that causes her constipation and she does not like the way it makes her feel.  She is concerned about constipation.  She was offered a trial of Viberzi in the past but states she never took it.  She denies any routine NSAID  use.  Has not tried Pepto-Bismol in the past.  Her last colonoscopy was in 2014 at which time she had diverticulosis and no polyps.  She did have biopsies of her colon which were normal.  Last EGD in 2018.  She cannot recall being tested for celiac disease in the past.  She does not think she is had basic lab work done in some time.  She otherwise denies any cardiopulmonary symptoms currently   EGD 06/03/2017 - Dr. Earlean Shawl - distal esophageal stricture dilated to 72m and 164mwith Savory dilator. "Possible EoE". Small hiatal hernia - biopsies negative for EoE  Colonoscopy 2014 - sigmoid diverticulosis, normal biopsies of the colon      Past Medical History:  Diagnosis Date   Acne  rosacea    Arthritis    Back pain    Chronic headaches    Colon polyp, hyperplastic    Diverticulitis    Esophageal stricture    GERD (gastroesophageal reflux disease)    Hiatal hernia    History of shingles 2010   HLD (hyperlipidemia)    IBS (irritable bowel syndrome)    OA (osteoarthritis) of knee    bilateral   OP (osteoporosis)    Seasonal allergies    Status post dilation of esophageal narrowing      Past Surgical History:  Procedure Laterality Date   ABDOMINAL HYSTERECTOMY     total 1999   ARTHRODESIS METATARSALPHALANGEAL JOINT (MTPJ) Left 09/17/2018   Procedure: left hallux metatarsal phalangeal joint arthrodesis and 2nd metatarsal Weil osteotomy;  Surgeon: HeWylene SimmerMD;  [facility] [facility];  Laterality: Left;  902m  BUNIONECTOMY Left    CERVICAL FUSION     COLONOSCOPY W/ POLYPECTOMY     FOOT SURGERY Left    corrective surgery   left knee arthroscopy     right knee arthroscopy     TONSILLECTOMY AND ADENOIDECTOMY     Family History  Problem Relation Age of Onset   Breast cancer Mother    Colon polyps Mother    Alcoholism Father    Colon polyps Father    Heart disease Father    Diabetes Brother    Social History   Tobacco Use   Smoking status: Never   Smokeless tobacco: Never  Vaping Use   Vaping Use: Never used  Substance Use Topics   Alcohol use: No    Alcohol/week: 0.0 standard drinks   Drug use: No   Current Outpatient Medications  Medication Sig Dispense Refill   atorvastatin (LIPITOR) 10 MG tablet Take 10 mg by mouth daily.     AZO-CRANBERRY PO Take 1 tablet by mouth as needed.     calcium carbonate (TUMS - DOSED IN MG ELEMENTAL CALCIUM) 500 MG chewable tablet Chew 1 tablet by mouth daily.     hydrOXYzine (ATARAX/VISTARIL) 10 MG tablet as needed.     loratadine (CLARITIN) 10 MG tablet Take 10 mg by mouth as needed.     naproxen sodium (ALEVE) 220 MG tablet Take 220 mg by mouth.     risedronate  (ACTONEL) 150 MG tablet Actonel 150 mg tablet  Take 1 tablet every month by oral route.     valACYclovir (VALTREX) 1000 MG tablet Take 1,000 mg by mouth as needed.     No current facility-administered medications for this visit.   No Known Allergies   Review of Systems: All systems reviewed and negative except where noted in HPI.    Labs from  2018 noted, nothing more recent on file in [place].  Physical Exam: BP (!) 146/90 (BP Location: Left Arm, Patient Position: Sitting, Cuff Size: Normal)   Pulse 80   Ht '5\' 1"'$  (1.549 m) Comment: height measured without shoes  Wt 145 lb (65.8 kg)   BMI 27.40 kg/m  Constitutional: Pleasant,well-developed, female in no acute distress. HEENT: Normocephalic and atraumatic. Conjunctivae are normal. No scleral icterus. Neck supple.  Cardiovascular: Normal rate, regular rhythm.  Pulmonary/chest: Effort normal and breath sounds normal.  Abdominal: Soft, nondistended, mild tenderness diffusely. There are no masses palpable.  Extremities: no edema Lymphadenopathy: No cervical adenopathy noted. Neurological: Alert and oriented to person place and time. Skin: Skin is warm and dry. No rashes noted. Psychiatric: Normal mood and affect. Behavior is normal.   ASSESSMENT AND PLAN: 70 year old female here for new patient assessment of following:  Chronic diarrhea Lower abdominal pain Fecal incontinence Dysphagia GERD  Constellation of symptoms as outlined above.  Given chronicity of symptoms over time and prior work-up, I agree she probably has a functional bowel disorder, consistent with IBS-D which we discussed in general.  She has not tried any dietary measures or medications recently for this.  She also has a history of reflux that is not being treated currently and has a history of distal esophageal stricture in the past that has responded to dilation.  I suspect this is likely causing her dysphagia.  We discussed options for work-up and treatment.   Given her ongoing reflux with dysphagia and history of benefit with dilation in the past, I offered her an endoscopy with likely dilation to further evaluate and treat this.  Discussed risk benefits of endoscopy and anesthesia with her and she wants to proceed.  At the same time a colonoscopy was over 8 years ago and in light of worsening lower tract symptoms I think reasonable to do a colonoscopy at the same time, ensure no interval changes.  She wished to proceed with both after discussion of them.  In interim she has not had labs for a while, will send CBC, TSH, screen her for celiac disease to make sure okay.  Otherwise we discussed management options.  I counseled her on dietary measures and gave her instructions and handouts about a low FODMAP diet to see if that will help.  Given her postprandial urgency I offered her a trial empirically of Colestid 1 g twice daily to see if that will help. For her abdominal cramps and loose stools she can also try some Levsin to use as needed.  For her reflux symptoms recommend starting omeprazole 40 mg once daily, will also give her some Zofran she can take as needed for nausea when she has severe episodes of her IBS.  We will evaluate her esophagus with EGD and perform dilation if stricture present.  Further recommendations pending the results and her course.  She agreed with the plan  Plan: - lab today - CBC, TSH, celiac labs today - EGD / colonoscopy - trial of Low FODMAP diet -  - start trial of Colestid 1gm BID - 60 RF1 - trial of Levsin 1 tab SL every 6 hours PRN 0.125 #30 RF1 - trial of Zofran '4mg'$  ODT every 6 hours PRN #30 RF1 - use prior to prep - trial of Omeprazole '40mg'$  / day - #30 RF1  Jolly Mango, MD [facility]  CC: Kathyrn Lass, MD

## 2021-06-13 NOTE — Patient Instructions (Signed)
If you are age 68 or older, your body mass index should be between 23-30. Your Body mass index is 27.4 kg/m. If this is out of the aforementioned range listed, please consider follow up with your Primary Care Provider.  If you are age 42 or younger, your body mass index should be between 19-25. Your Body mass index is 27.4 kg/m. If this is out of the aformentioned range listed, please consider follow up with your Primary Care Provider.   __________________________________________________________  The [facility] providers would like to encourage you to use [facility] to communicate with providers for non-urgent requests or questions.  Due to long hold times on the telephone, sending your provider a message by [facility] may be a faster and more efficient way to get a response.  Please allow 48 business hours for a response.  Please remember that this is for non-urgent requests.   Your provider has requested that you go to the basement level for lab work before leaving today. Press "B" on the elevator. The lab is located at the first door on the left as you exit the elevator.  We have sent the following medications to your pharmacy for you to pick up at your convenience:  Colestid, Levsin, Zofran, Omeprazole  Take 1 Zofran about 20 minutes before taking each prep.

## 2021-06-14 LAB — TISSUE TRANSGLUTAMINASE, IGA: (tTG) Ab, IgA: <1 U/mL

## 2021-06-14 LAB — IGA: Immunoglobulin A: 406 mg/dL — ABNORMAL HIGH (ref 70–320)

## 2021-06-19 ENCOUNTER — Other Ambulatory Visit: Payer: Self-pay

## 2021-06-19 MED ORDER — SUTAB 1479-225-188 MG PO TABS: 1.0000 | ORAL_TABLET | Freq: Once | ORAL | 0 refills | Status: AC

## 2021-06-20 ENCOUNTER — Encounter: Payer: Self-pay | Admitting: Gastroenterology

## 2021-06-28 ENCOUNTER — Encounter: Payer: Self-pay | Admitting: Gastroenterology

## 2021-06-28 ENCOUNTER — Ambulatory Visit (AMBULATORY_SURGERY_CENTER): Payer: Medicare HMO | Admitting: Gastroenterology

## 2021-06-28 ENCOUNTER — Other Ambulatory Visit: Payer: Self-pay

## 2021-06-28 VITALS — BP 149/90 | HR 69 | Temp 96.8°F | Resp 12 | Ht 61.0 in | Wt 145.0 lb

## 2021-06-28 DIAGNOSIS — K5730 Diverticulosis of large intestine without perforation or abscess without bleeding: Secondary | ICD-10-CM | POA: Diagnosis not present

## 2021-06-28 DIAGNOSIS — Z8719 Personal history of other diseases of the digestive system: Secondary | ICD-10-CM

## 2021-06-28 DIAGNOSIS — K222 Esophageal obstruction: Secondary | ICD-10-CM | POA: Diagnosis not present

## 2021-06-28 DIAGNOSIS — K529 Noninfective gastroenteritis and colitis, unspecified: Secondary | ICD-10-CM

## 2021-06-28 DIAGNOSIS — K219 Gastro-esophageal reflux disease without esophagitis: Secondary | ICD-10-CM

## 2021-06-28 DIAGNOSIS — R1030 Lower abdominal pain, unspecified: Secondary | ICD-10-CM

## 2021-06-28 DIAGNOSIS — R1310 Dysphagia, unspecified: Secondary | ICD-10-CM

## 2021-06-28 HISTORY — PX: COLONOSCOPY W/ POLYPECTOMY: SHX1380

## 2021-06-28 HISTORY — DX: Personal history of other diseases of the digestive system: Z87.19

## 2021-06-28 MED ORDER — SODIUM CHLORIDE 0.9 % IV SOLN: 500.0000 mL | Freq: Once | INTRAVENOUS | Status: DC

## 2021-06-28 NOTE — Op Note (Signed)
[facility] Patient Name: Tammy Cooley Procedure Date: 06/28/2021 2:11 PM MRN: [id number] Endoscopist: Remo Lipps P. Havery Moros , MD Age: 70 Referring MD:  Date of Birth: 1950/10/01 Gender: Female Account #: [id number] Procedure:                Colonoscopy Indications:              Chronic diarrhea, lower abdominal discomfort -                            improved with Colestid / Levsin PRN since clinic                            visit Medicines:                Monitored Anesthesia Care Procedure:                Pre-Anesthesia Assessment:                           - Prior to the procedure, a History and Physical                            was performed, and patient medications and                            allergies were reviewed. The patient's tolerance of                            previous anesthesia was also reviewed. The risks                            and benefits of the procedure and the sedation                            options and risks were discussed with the patient.                            All questions were answered, and informed consent                            was obtained. Prior Anticoagulants: The patient has                            taken no previous anticoagulant or antiplatelet                            agents. ASA Grade Assessment: II - A patient with                            mild systemic disease. After reviewing the risks                            and benefits, the patient was deemed in  satisfactory condition to undergo the procedure.                           After obtaining informed consent, the colonoscope                            was passed under direct vision. Throughout the                            procedure, the patient's blood pressure, pulse, and                            oxygen saturations were monitored continuously. The                            Olympus PCF-H190DL [id number]) Colonoscope was                             introduced through the anus and advanced to the the                            terminal ileum, with identification of the                            appendiceal orifice and IC valve. The colonoscopy                            was performed without difficulty. The patient                            tolerated the procedure well. The quality of the                            bowel preparation was good. The terminal ileum,                            ileocecal valve, appendiceal orifice, and rectum                            were photographed. Scope In: 2:30:57 PM Scope Out: 2:48:10 PM Scope Withdrawal Time: 0 hours 13 minutes 34 seconds  Total Procedure Duration: 0 hours 17 minutes 13 seconds  Findings:                 The perianal and digital rectal examinations were                            normal.                           The terminal ileum appeared normal.                           Multiple small-mouthed diverticula were found in  the sigmoid colon.                           The exam was otherwise without abnormality.                           Biopsies for histology were taken with a cold                            forceps from the right colon, left colon and                            transverse colon for evaluation of microscopic                            colitis. Complications:            No immediate complications. Estimated blood loss:                            Minimal. Estimated Blood Loss:     Estimated blood loss was minimal. Impression:               - The examined portion of the ileum was normal.                           - Diverticulosis in the sigmoid colon.                           - The examination was otherwise normal. No overt                            inflammatory changes.                           - Biopsies were taken with a cold forceps from the                            right colon, left colon and transverse colon  for                            evaluation of microscopic colitis. Recommendation:           - Patient has a contact number available for                            emergencies. The signs and symptoms of potential                            delayed complications were discussed with the                            patient. Return to normal activities tomorrow.                            Written discharge instructions were provided to the  patient.                           - Resume previous diet.                           - Continue present medications.                           - Await pathology results.                           - No further colon cancer screening is warranted                            (would not be due until another 10 years, at that                            point will be > 27 years old) Carlota Raspberry. Havery Moros, MD 06/28/2021 2:53:12 PM This report has been signed electronically.

## 2021-06-28 NOTE — Progress Notes (Signed)
Called to room to assist during endoscopic procedure.  Patient ID and intended procedure confirmed with present staff. Received instructions for my participation in the procedure from the performing physician.  

## 2021-06-28 NOTE — Progress Notes (Signed)
Vs by Nicolaus  I have reviewed the patient's medical history in detail and updated the computerized patient record.

## 2021-06-28 NOTE — Progress Notes (Signed)
A and O x3. Report to RN. Tolerated MAC anesthesia well.Teeth unchanged after procedure. 

## 2021-06-28 NOTE — Op Note (Signed)
[facility] Patient Name: Tammy Cooley Procedure Date: 06/28/2021 2:10 PM MRN: [id number] Endoscopist: Remo Lipps P. Havery Moros , MD Age: 70 Referring MD:  Date of Birth: [birth year] Gender: Female Account #: [id number] Procedure:                Upper GI endoscopy Indications:              Dysphagia, Follow-up of gastro-esophageal reflux                            disease - improved on omeprazole 40mg  / day since                            clinic visit Medicines:                Monitored Anesthesia Care Procedure:                Pre-Anesthesia Assessment:                           - Prior to the procedure, a History and Physical                            was performed, and patient medications and                            allergies were reviewed. The patient's tolerance of                            previous anesthesia was also reviewed. The risks                            and benefits of the procedure and the sedation                            options and risks were discussed with the patient.                            All questions were answered, and informed consent                            was obtained. Prior Anticoagulants: The patient has                            taken no previous anticoagulant or antiplatelet                            agents. ASA Grade Assessment: II - A patient with                            mild systemic disease. After reviewing the risks                            and benefits, the patient was deemed in  satisfactory condition to undergo the procedure.                           After obtaining informed consent, the endoscope was                            passed under direct vision. Throughout the                            procedure, the patient's blood pressure, pulse, and                            oxygen saturations were monitored continuously. The                            GIF [id number] was introduced through  the                            mouth, and advanced to the second part of duodenum.                            The upper GI endoscopy was accomplished without                            difficulty. The patient tolerated the procedure                            well. Scope In: Scope Out: Findings:                 Esophagogastric landmarks were identified: the                            Z-line was found at 33 cm, the gastroesophageal                            junction was found at 33 cm and the upper extent of                            the gastric folds was found at 36 cm from the                            incisors.                           A 3 cm hiatal hernia was present.                           One benign-appearing, intrinsic mild stenosis was                            found 33 cm from the incisors. This stenosis                            measured less than one  cm (in length). A TTS                            dilator was passed through the scope. Dilation with                            an 18-19-20 mm balloon dilator was performed to 18                            mm and 19 mm after which an appropriate mucosal                            wrent was noted.                           The exam of the esophagus was otherwise normal.                           The entire examined stomach was normal.                           The duodenal bulb and second portion of the                            duodenum were normal. Complications:            No immediate complications. Estimated blood loss:                            Minimal. Estimated Blood Loss:     Estimated blood loss was minimal. Impression:               - Esophagogastric landmarks identified.                           - 3 cm hiatal hernia.                           - Benign-appearing esophageal stenosis. Dilated to                            44mm with a good result.                           - Normal esophagus otherwise - no  erosive changes                            or Barrett's                           - Normal stomach.                           - Normal duodenal bulb and second portion of the                            duodenum.  Recommendation:           - Patient has a contact number available for                            emergencies. The signs and symptoms of potential                            delayed complications were discussed with the                            patient. Return to normal activities tomorrow.                            Written discharge instructions were provided to the                            patient.                           - Post dilation diet.                           - Continue present medications.                           - Follow up as needed if symptoms recur / persist Novak Stgermaine P. Princessa Lesmeister, MD 06/28/2021 2:58:10 PM This report has been signed electronically.

## 2021-06-28 NOTE — Patient Instructions (Signed)
Please read handouts provided. Continue present medications. Await pathology results. Post Dilation Diet. Follow-up as needed.   YOU HAD AN ENDOSCOPIC PROCEDURE TODAY AT [facility]:   Refer to the procedure report that was given to you for any specific questions about what was found during the examination.  If the procedure report does not answer your questions, please call your gastroenterologist to clarify.  If you requested that your care partner not be given the details of your procedure findings, then the procedure report has been included in a sealed envelope for you to review at your convenience later.  YOU SHOULD EXPECT: Some feelings of bloating in the abdomen. Passage of more gas than usual.  Walking can help get rid of the air that was put into your GI tract during the procedure and reduce the bloating. If you had a lower endoscopy (such as a colonoscopy or flexible sigmoidoscopy) you may notice spotting of blood in your stool or on the toilet paper. If you underwent a bowel prep for your procedure, you may not have a normal bowel movement for a few days.  Please Note:  You might notice some irritation and congestion in your nose or some drainage.  This is from the oxygen used during your procedure.  There is no need for concern and it should clear up in a day or so.  SYMPTOMS TO REPORT IMMEDIATELY:  Following lower endoscopy (colonoscopy or flexible sigmoidoscopy):  Excessive amounts of blood in the stool  Significant tenderness or worsening of abdominal pains  Swelling of the abdomen that is new, acute  Fever of 100F or higher  Following upper endoscopy (EGD)  Vomiting of blood or coffee ground material  New chest pain or pain under the shoulder blades  Painful or persistently difficult swallowing  New shortness of breath  Fever of 100F or higher  Black, tarry-looking stools  For urgent or emergent issues, a gastroenterologist can be reached at any hour  by calling [phone]. Do not use MyChart messaging for urgent concerns.    DIET:  Drink plenty of fluids but you should avoid alcoholic beverages for 24 hours.  ACTIVITY:  You should plan to take it easy for the rest of today and you should NOT DRIVE or use heavy machinery until tomorrow (because of the sedation medicines used during the test).    FOLLOW UP: Our staff will call the number listed on your records 48-72 hours following your procedure to check on you and address any questions or concerns that you may have regarding the information given to you following your procedure. If we do not reach you, we will leave a message.  We will attempt to reach you two times.  During this call, we will ask if you have developed any symptoms of COVID 19. If you develop any symptoms (ie: fever, flu-like symptoms, shortness of breath, cough etc.) before then, please call [phone].  If you test positive for Covid 19 in the 2 weeks post procedure, please call and report this information to [place].    If any biopsies were taken you will be contacted by phone or by letter within the next 1-3 weeks.  Please call us at [phone] if you have not heard about the biopsies in 3 weeks.    SIGNATURES/CONFIDENTIALITY: You and/or your care partner have signed paperwork which will be entered into your electronic medical record.  These signatures attest to the fact that that the information above on your After  Visit Summary has been reviewed and is understood.  Full responsibility of the confidentiality of this discharge information lies with you and/or your care-partner.

## 2021-06-28 NOTE — Progress Notes (Signed)
History and Physical Interval Note: See note from 06/13/21 for full details of patient's case. HEre for EGD with possible dilation and colonoscopy today. No interval changes since I have seen her. Doing better on meds as prescribed in clinic. No cardiopulmonary symptoms.  06/28/2021 2:13 PM  Tammy Cooley  has presented today for endoscopic procedure(s), with the diagnosis of  Encounter Diagnoses  Name Primary?   Gastroesophageal reflux disease, unspecified whether esophagitis present Yes   Dysphagia, unspecified type    Chronic diarrhea    Lower abdominal pain   .  The various methods of evaluation and treatment have been discussed with the patient and/or family. After consideration of risks, benefits and other options for treatment, the patient has consented to  the endoscopic procedure(s).   The patient's history has been reviewed, patient examined, no change in status, stable for surgery.  I have reviewed the patient's chart and labs.  Questions were answered to the patient's satisfaction.    Jolly Mango, MD [facility]

## 2021-07-02 ENCOUNTER — Telehealth: Payer: Self-pay

## 2021-07-02 NOTE — Telephone Encounter (Signed)
  Follow up Call-  Call back number 06/28/2021  Post procedure Call Back phone  # [phone]  Permission to leave phone message Yes  Some recent data might be hidden     Patient questions:  Do you have a fever, pain , or abdominal swelling? No. Pain Score  0 *  Have you tolerated food without any problems? Yes.    Have you been able to return to your normal activities? Yes.    Do you have any questions about your discharge instructions: Diet   No. Medications  No. Follow up visit  No.  Do you have questions or concerns about your Care? No.  Actions: * If pain score is 4 or above: No action needed, pain <4.   Have you developed a fever since your procedure? no  2.   Have you had an respiratory symptoms (SOB or cough) since your procedure? no  3.   Have you tested positive for COVID 19 since your procedure no  4.   Have you had any family members/close contacts diagnosed with the COVID 19 since your procedure?  no   If yes to any of these questions please route to Joylene John, RN and Joella Prince, RN

## 2021-08-30 DIAGNOSIS — C44722 Squamous cell carcinoma of skin of right lower limb, including hip: Secondary | ICD-10-CM | POA: Diagnosis not present

## 2021-08-30 DIAGNOSIS — L309 Dermatitis, unspecified: Secondary | ICD-10-CM | POA: Diagnosis not present

## 2021-08-30 DIAGNOSIS — L718 Other rosacea: Secondary | ICD-10-CM | POA: Diagnosis not present

## 2021-08-30 DIAGNOSIS — D485 Neoplasm of uncertain behavior of skin: Secondary | ICD-10-CM | POA: Diagnosis not present

## 2021-08-30 DIAGNOSIS — L570 Actinic keratosis: Secondary | ICD-10-CM | POA: Diagnosis not present

## 2021-08-30 DIAGNOSIS — L565 Disseminated superficial actinic porokeratosis (DSAP): Secondary | ICD-10-CM | POA: Diagnosis not present

## 2021-09-05 DIAGNOSIS — M25562 Pain in left knee: Secondary | ICD-10-CM | POA: Diagnosis not present

## 2021-09-05 DIAGNOSIS — M1712 Unilateral primary osteoarthritis, left knee: Secondary | ICD-10-CM | POA: Diagnosis not present

## 2021-09-10 DIAGNOSIS — E7800 Pure hypercholesterolemia, unspecified: Secondary | ICD-10-CM | POA: Diagnosis not present

## 2021-09-10 DIAGNOSIS — Z0000 Encounter for general adult medical examination without abnormal findings: Secondary | ICD-10-CM | POA: Diagnosis not present

## 2021-09-10 DIAGNOSIS — K219 Gastro-esophageal reflux disease without esophagitis: Secondary | ICD-10-CM | POA: Diagnosis not present

## 2021-11-29 DIAGNOSIS — D2339 Other benign neoplasm of skin of other parts of face: Secondary | ICD-10-CM | POA: Diagnosis not present

## 2021-12-06 DIAGNOSIS — L942 Calcinosis cutis: Secondary | ICD-10-CM | POA: Diagnosis not present

## 2021-12-26 DIAGNOSIS — R1013 Epigastric pain: Secondary | ICD-10-CM | POA: Diagnosis not present

## 2021-12-26 DIAGNOSIS — R195 Other fecal abnormalities: Secondary | ICD-10-CM | POA: Diagnosis not present

## 2021-12-26 DIAGNOSIS — R059 Cough, unspecified: Secondary | ICD-10-CM | POA: Diagnosis not present

## 2021-12-27 ENCOUNTER — Encounter: Payer: Self-pay | Admitting: Gastroenterology

## 2021-12-29 DIAGNOSIS — J069 Acute upper respiratory infection, unspecified: Secondary | ICD-10-CM | POA: Diagnosis not present

## 2022-01-01 ENCOUNTER — Ambulatory Visit: Payer: Medicare HMO | Admitting: Gastroenterology

## 2022-01-07 DIAGNOSIS — J0140 Acute pansinusitis, unspecified: Secondary | ICD-10-CM | POA: Diagnosis not present

## 2022-01-07 DIAGNOSIS — H1033 Unspecified acute conjunctivitis, bilateral: Secondary | ICD-10-CM | POA: Diagnosis not present

## 2022-02-01 DIAGNOSIS — Z1231 Encounter for screening mammogram for malignant neoplasm of breast: Secondary | ICD-10-CM | POA: Diagnosis not present

## 2022-02-20 DIAGNOSIS — Z72820 Sleep deprivation: Secondary | ICD-10-CM | POA: Diagnosis not present

## 2022-02-20 DIAGNOSIS — K9049 Malabsorption due to intolerance, not elsewhere classified: Secondary | ICD-10-CM | POA: Diagnosis not present

## 2022-02-20 DIAGNOSIS — E663 Overweight: Secondary | ICD-10-CM | POA: Diagnosis not present

## 2022-02-20 DIAGNOSIS — R5383 Other fatigue: Secondary | ICD-10-CM | POA: Diagnosis not present

## 2022-02-20 DIAGNOSIS — R948 Abnormal results of function studies of other organs and systems: Secondary | ICD-10-CM | POA: Diagnosis not present

## 2022-02-20 DIAGNOSIS — Z1389 Encounter for screening for other disorder: Secondary | ICD-10-CM | POA: Diagnosis not present

## 2022-02-20 DIAGNOSIS — E538 Deficiency of other specified B group vitamins: Secondary | ICD-10-CM | POA: Diagnosis not present

## 2022-02-20 DIAGNOSIS — E785 Hyperlipidemia, unspecified: Secondary | ICD-10-CM | POA: Diagnosis not present

## 2022-02-20 DIAGNOSIS — R7301 Impaired fasting glucose: Secondary | ICD-10-CM | POA: Diagnosis not present

## 2022-02-20 DIAGNOSIS — M810 Age-related osteoporosis without current pathological fracture: Secondary | ICD-10-CM | POA: Diagnosis not present

## 2022-02-20 DIAGNOSIS — K219 Gastro-esophageal reflux disease without esophagitis: Secondary | ICD-10-CM | POA: Diagnosis not present

## 2022-02-20 DIAGNOSIS — R0602 Shortness of breath: Secondary | ICD-10-CM | POA: Diagnosis not present

## 2022-03-06 DIAGNOSIS — R948 Abnormal results of function studies of other organs and systems: Secondary | ICD-10-CM | POA: Diagnosis not present

## 2022-03-06 DIAGNOSIS — D519 Vitamin B12 deficiency anemia, unspecified: Secondary | ICD-10-CM | POA: Diagnosis not present

## 2022-03-06 DIAGNOSIS — M8580 Other specified disorders of bone density and structure, unspecified site: Secondary | ICD-10-CM | POA: Diagnosis not present

## 2022-03-06 DIAGNOSIS — E559 Vitamin D deficiency, unspecified: Secondary | ICD-10-CM | POA: Diagnosis not present

## 2022-03-06 DIAGNOSIS — Z6826 Body mass index (BMI) 26.0-26.9, adult: Secondary | ICD-10-CM | POA: Diagnosis not present

## 2022-04-30 DIAGNOSIS — R1032 Left lower quadrant pain: Secondary | ICD-10-CM | POA: Diagnosis not present

## 2022-04-30 DIAGNOSIS — R948 Abnormal results of function studies of other organs and systems: Secondary | ICD-10-CM | POA: Diagnosis not present

## 2022-04-30 DIAGNOSIS — E663 Overweight: Secondary | ICD-10-CM | POA: Diagnosis not present

## 2022-04-30 DIAGNOSIS — E538 Deficiency of other specified B group vitamins: Secondary | ICD-10-CM | POA: Diagnosis not present

## 2022-04-30 DIAGNOSIS — E559 Vitamin D deficiency, unspecified: Secondary | ICD-10-CM | POA: Diagnosis not present

## 2022-05-29 DIAGNOSIS — H5203 Hypermetropia, bilateral: Secondary | ICD-10-CM | POA: Diagnosis not present

## 2022-05-29 DIAGNOSIS — H2513 Age-related nuclear cataract, bilateral: Secondary | ICD-10-CM | POA: Diagnosis not present

## 2022-05-29 DIAGNOSIS — Z0100 Encounter for examination of eyes and vision without abnormal findings: Secondary | ICD-10-CM | POA: Diagnosis not present

## 2022-05-29 DIAGNOSIS — H04123 Dry eye syndrome of bilateral lacrimal glands: Secondary | ICD-10-CM | POA: Diagnosis not present

## 2022-06-05 ENCOUNTER — Other Ambulatory Visit (HOSPITAL_BASED_OUTPATIENT_CLINIC_OR_DEPARTMENT_OTHER): Payer: Self-pay | Admitting: Family Medicine

## 2022-06-05 ENCOUNTER — Ambulatory Visit (HOSPITAL_BASED_OUTPATIENT_CLINIC_OR_DEPARTMENT_OTHER)
Admission: RE | Admit: 2022-06-05 | Discharge: 2022-06-05 | Disposition: A | Discharge status: Home / Self Care | Payer: Medicare HMO | Source: Ambulatory Visit | Attending: Family Medicine | Admitting: Family Medicine

## 2022-06-05 DIAGNOSIS — K828 Other specified diseases of gallbladder: Secondary | ICD-10-CM | POA: Diagnosis not present

## 2022-06-05 DIAGNOSIS — I700 Atherosclerosis of aorta: Secondary | ICD-10-CM | POA: Diagnosis not present

## 2022-06-05 DIAGNOSIS — N132 Hydronephrosis with renal and ureteral calculous obstruction: Secondary | ICD-10-CM | POA: Insufficient documentation

## 2022-06-05 DIAGNOSIS — K5730 Diverticulosis of large intestine without perforation or abscess without bleeding: Secondary | ICD-10-CM | POA: Diagnosis not present

## 2022-06-05 DIAGNOSIS — M545 Low back pain, unspecified: Secondary | ICD-10-CM | POA: Diagnosis not present

## 2022-06-05 DIAGNOSIS — K449 Diaphragmatic hernia without obstruction or gangrene: Secondary | ICD-10-CM | POA: Insufficient documentation

## 2022-06-07 ENCOUNTER — Other Ambulatory Visit: Payer: Self-pay

## 2022-06-07 ENCOUNTER — Emergency Department (HOSPITAL_BASED_OUTPATIENT_CLINIC_OR_DEPARTMENT_OTHER)
Admission: EM | Admit: 2022-06-07 | Discharge: 2022-06-07 | Disposition: A | Discharge status: Home / Self Care | Payer: Medicare HMO | Attending: Emergency Medicine | Admitting: Emergency Medicine

## 2022-06-07 ENCOUNTER — Encounter (HOSPITAL_BASED_OUTPATIENT_CLINIC_OR_DEPARTMENT_OTHER): Payer: Self-pay

## 2022-06-07 DIAGNOSIS — N23 Unspecified renal colic: Secondary | ICD-10-CM

## 2022-06-07 DIAGNOSIS — N201 Calculus of ureter: Secondary | ICD-10-CM | POA: Insufficient documentation

## 2022-06-07 DIAGNOSIS — R109 Unspecified abdominal pain: Secondary | ICD-10-CM | POA: Diagnosis present

## 2022-06-07 LAB — URINALYSIS, MICROSCOPIC (REFLEX)

## 2022-06-07 LAB — URINALYSIS, ROUTINE W REFLEX MICROSCOPIC
Bilirubin Urine: NEGATIVE
Glucose, UA: NEGATIVE mg/dL
Ketones, ur: NEGATIVE mg/dL
Leukocytes,Ua: NEGATIVE
Nitrite: NEGATIVE
Protein, ur: NEGATIVE mg/dL
Specific Gravity, Urine: 1.015 (ref 1.005–1.030)
pH: 7.5 (ref 5.0–8.0)

## 2022-06-07 LAB — BASIC METABOLIC PANEL
Anion gap: 9 (ref 5–15)
BUN: 14 mg/dL (ref 8–23)
CO2: 24 mmol/L (ref 22–32)
Calcium: 9.2 mg/dL (ref 8.9–10.3)
Chloride: 106 mmol/L (ref 98–111)
Creatinine, Ser: 1.1 mg/dL — ABNORMAL HIGH (ref 0.44–1.00)
GFR, Estimated: 54 mL/min — ABNORMAL LOW (ref >60)
Glucose, Bld: 122 mg/dL — ABNORMAL HIGH (ref 70–99)
Potassium: 3.1 mmol/L — ABNORMAL LOW (ref 3.5–5.1)
Sodium: 139 mmol/L (ref 135–145)

## 2022-06-07 LAB — CBC
HCT: 38.5 % (ref 36.0–46.0)
Hemoglobin: 12.9 g/dL (ref 12.0–15.0)
MCH: 31.5 pg (ref 26.0–34.0)
MCHC: 33.5 g/dL (ref 30.0–36.0)
MCV: 93.9 fL (ref 80.0–100.0)
Platelets: 208 10*3/uL (ref 150–400)
RBC: 4.1 MIL/uL (ref 3.87–5.11)
RDW: 12 % (ref 11.5–15.5)
WBC: 4.8 10*3/uL (ref 4.0–10.5)
nRBC: 0 % (ref 0.0–0.2)

## 2022-06-07 MED ORDER — KETOROLAC TROMETHAMINE 15 MG/ML IJ SOLN
15.0000 mg | Freq: Once | INTRAMUSCULAR | Status: AC
  Administered 2022-06-07: 15 mg via INTRAVENOUS
  Filled 2022-06-07: qty 1

## 2022-06-07 MED ORDER — SODIUM CHLORIDE 0.9 % IV BOLUS: 1000.0000 mL | Freq: Once | INTRAVENOUS | Status: DC

## 2022-06-07 MED ORDER — SODIUM CHLORIDE 0.9 % IV BOLUS
1000.0000 mL | Freq: Once | INTRAVENOUS | Status: AC
  Administered 2022-06-07: 1000 mL via INTRAVENOUS

## 2022-06-07 MED ORDER — ONDANSETRON HCL 4 MG PO TABS
4.0000 mg | ORAL_TABLET | Freq: Four times a day (QID) | ORAL | 0 refills | Status: DC
Start: 2022-06-07 — End: 2024-04-13

## 2022-06-07 MED ORDER — OXYCODONE HCL 5 MG PO TABS
5.0000 mg | ORAL_TABLET | Freq: Once | ORAL | Status: AC
  Administered 2022-06-07: 5 mg via ORAL
  Filled 2022-06-07: qty 1

## 2022-06-07 MED ORDER — POTASSIUM CHLORIDE 20 MEQ PO PACK
40.0000 meq | PACK | Freq: Once | ORAL | Status: AC
  Administered 2022-06-07: 40 meq via ORAL
  Filled 2022-06-07: qty 2

## 2022-06-07 MED ORDER — ONDANSETRON HCL 4 MG/2ML IJ SOLN
4.0000 mg | Freq: Once | INTRAMUSCULAR | Status: AC
  Administered 2022-06-07: 4 mg via INTRAVENOUS
  Filled 2022-06-07: qty 2

## 2022-06-07 NOTE — ED Provider Notes (Signed)
[facility] EMERGENCY DEPARTMENT Provider Note  CSN: [id number] Arrival date & time: 06/07/22 1407  Chief Complaint(s) Flank Pain  HPI Tammy Cooley is a 71 y.o. female presenting to the emergency department with left flank pain.  She reports left flank pain since 2 days ago, intermittently, with associated mild nausea.  No vomiting.  She saw her primary doctor prescribed tramadol, without significant improvement.  CT scan was obtained of the abdomen as outpatient demonstrating left 3 x 4 mm distal left ureteral stone.  Patient presents emergency department today because she has continued pain.  She reports decreased urination but denies fevers, chills, dysuria, lightheadedness, dizziness.  Symptoms moderate.   Past Medical History Past Medical History:  Diagnosis Date   Acne rosacea    Arthritis    Back pain    Chronic headaches    Colon polyp, hyperplastic    Diverticulitis    Esophageal stricture    GERD (gastroesophageal reflux disease)    Hiatal hernia    History of shingles 2010   HLD (hyperlipidemia)    IBS (irritable bowel syndrome)    OA (osteoarthritis) of knee    bilateral   OP (osteoporosis)    Seasonal allergies    Status post dilation of esophageal narrowing    Patient Active Problem List   Diagnosis Date Noted   Mastitis 02/17/2020   Menopausal syndrome 02/17/2020   Osteopenia 02/17/2020   Hypercholesterolemia 08/25/2019   Bunion 10/09/2018   Metatarsalgia of left foot 10/09/2018   Osteoarthritis of knee 10/29/2017   GERD (gastroesophageal reflux disease) 05/09/2017   Irritable bowel syndrome with diarrhea 05/09/2017   Osteoporosis 12/04/2015   Home Medication(s) Prior to Admission medications   Medication Sig Start Date End Date Taking? Authorizing Provider  ondansetron (ZOFRAN) 4 MG tablet Take 1 tablet (4 mg total) by mouth every 6 (six) hours. 06/07/22  Yes Cristie Hem, MD  atorvastatin (LIPITOR) 10 MG tablet Take 10 mg by mouth  daily. 02/15/20   [provider]  AZO-CRANBERRY PO Take 1 tablet by mouth as needed.    [provider]  calcium carbonate (TUMS - DOSED IN MG ELEMENTAL CALCIUM) 500 MG chewable tablet Chew 1 tablet by mouth daily.    [provider]  colestipol (COLESTID) 1 g tablet Take 2 tablets (2 g total) by mouth 2 (two) times daily. 06/13/21   Armbruster, Carlota Raspberry, MD  hydrOXYzine (ATARAX/VISTARIL) 10 MG tablet as needed.    [provider]  hyoscyamine (LEVSIN SL) 0.125 MG SL tablet Place 1 tablet (0.125 mg total) under the tongue every 6 (six) hours as needed. 06/13/21   Armbruster, Carlota Raspberry, MD  loratadine (CLARITIN) 10 MG tablet Take 10 mg by mouth as needed.    [provider]  naproxen sodium (ALEVE) 220 MG tablet Take 220 mg by mouth.    [provider]  omeprazole (PRILOSEC) 40 MG capsule Take 1 capsule (40 mg total) by mouth daily. 06/13/21   Armbruster, Carlota Raspberry, MD  ondansetron (ZOFRAN ODT) 4 MG disintegrating tablet Take 1 tablet (4 mg total) by mouth every 6 (six) hours as needed for nausea or vomiting. 06/13/21   Armbruster, Carlota Raspberry, MD  risedronate (ACTONEL) 150 MG tablet Actonel 150 mg tablet  Take 1 tablet every month by oral route.    [provider]  valACYclovir (VALTREX) 1000 MG tablet Take 1,000 mg by mouth as needed. 09/13/20   [provider]  Past Surgical History Past Surgical History:  Procedure Laterality Date   ABDOMINAL HYSTERECTOMY     total 1999   ARTHRODESIS METATARSALPHALANGEAL JOINT (MTPJ) Left 09/17/2018   Procedure: left hallux metatarsal phalangeal joint arthrodesis and 2nd metatarsal Weil osteotomy;  Surgeon: Wylene Simmer, MD;  Location: [facility];  Service: Orthopedics;  Laterality: Left;  42mn   BUNIONECTOMY Left    CERVICAL FUSION     COLONOSCOPY W/  POLYPECTOMY     FOOT SURGERY Left    corrective surgery   left knee arthroscopy     right knee arthroscopy     TONSILLECTOMY AND ADENOIDECTOMY     Family History Family History  Problem Relation Age of Onset   Breast cancer Mother    Colon polyps Mother    Alcoholism Father    Colon polyps Father    Heart disease Father    Diabetes Brother     Social History Social History   Tobacco Use   Smoking status: Never   Smokeless tobacco: Never  Vaping Use   Vaping Use: Never used  Substance Use Topics   Alcohol use: No    Alcohol/week: 0.0 standard drinks of alcohol   Drug use: No   Allergies Patient has no known allergies.  Review of Systems Review of Systems  All other systems reviewed and are negative.   Physical Exam Vital Signs  I have reviewed the triage vital signs BP 133/74   Pulse 73   Temp (!) 97.5 F (36.4 C)   Resp 16   Ht '5\' 2"'$  (1.575 m)   Wt 60.8 kg   SpO2 100%   BMI 24.51 kg/m  Physical Exam Vitals and nursing note reviewed.  Constitutional:      General: She is not in acute distress.    Appearance: She is well-developed.  HENT:     Head: Normocephalic and atraumatic.     Mouth/Throat:     Mouth: Mucous membranes are moist.  Eyes:     Pupils: Pupils are equal, round, and reactive to light.  Cardiovascular:     Rate and Rhythm: Normal rate and regular rhythm.     Heart sounds: No murmur heard. Pulmonary:     Effort: Pulmonary effort is normal. No respiratory distress.     Breath sounds: Normal breath sounds.  Abdominal:     General: Abdomen is flat.     Palpations: Abdomen is soft.     Tenderness: There is no abdominal tenderness. There is no right CVA tenderness or left CVA tenderness.  Musculoskeletal:        General: No tenderness.     Right lower leg: No edema.     Left lower leg: No edema.  Skin:    General: Skin is warm and dry.  Neurological:     General: No focal deficit present.     Mental Status: She is alert. Mental  status is at baseline.  Psychiatric:        Mood and Affect: Mood normal.        Behavior: Behavior normal.     ED Results and Treatments Labs (all labs ordered are listed, but only abnormal results are displayed) Labs Reviewed  URINALYSIS, ROUTINE W REFLEX MICROSCOPIC - Abnormal; Notable for the following components:      Result Value   Hgb urine dipstick MODERATE (*)    All other components within normal limits  BASIC METABOLIC PANEL - Abnormal; Notable for the following components:   Potassium  3.1 (*)    Glucose, Bld 122 (*)    Creatinine, Ser 1.10 (*)    GFR, Estimated 54 (*)    All other components within normal limits  URINALYSIS, MICROSCOPIC (REFLEX) - Abnormal; Notable for the following components:   Bacteria, UA RARE (*)    All other components within normal limits  CBC                                                                                                                          Radiology No results found.  Pertinent labs & imaging results that were available during my care of the patient were reviewed by me and considered in my medical decision making (see MDM for details).  Medications Ordered in ED Medications  ondansetron (ZOFRAN) injection 4 mg (4 mg Intravenous Given 06/07/22 1522)  sodium chloride 0.9 % bolus 1,000 mL (0 mLs Intravenous Stopped 06/07/22 1814)  ketorolac (TORADOL) 15 MG/ML injection 15 mg (15 mg Intravenous Given 06/07/22 1522)  potassium chloride (KLOR-CON) packet 40 mEq (40 mEq Oral Given 06/07/22 1600)  ketorolac (TORADOL) 15 MG/ML injection 15 mg (15 mg Intravenous Given 06/07/22 1614)  sodium chloride 0.9 % bolus 1,000 mL (0 mLs Intravenous Stopped 06/07/22 1837)  oxyCODONE (Oxy IR/ROXICODONE) immediate release tablet 5 mg (5 mg Oral Given 06/07/22 1719)                                                                                                                                     Procedures Procedures  (including critical care  time)  Medical Decision Making / ED Course   MDM:  71 year old female presenting to the emergency department with flank pain.  Patient well-appearing, appears in mild discomfort but no acute distress.  No flank tenderness on exam.  Labs interpreted by me, notable for no leukocytosis.  Very mild AKI.  Mild hypokalemia.  Pending urinalysis.  Suspect pain is due to recently diagnosed kidney stone.  Patient reports the same pain that is present as when her CT was performed yesterday.  Her pain is intermittent.  Will obtain urinalysis to evaluate for infection, although lower concern without dysuria, leukocytosis, fevers.  Will treat symptoms with Toradol, Zofran.  CT without other acute intra-abdominal pathology and no abdominal tenderness on exam.  Will reassess.  Clinical Course as of 06/08/22 1120  Fri Jun 07, 2022  1753 Feels much better.  Urinalysis reassuring, no nitrite, leukocytes to suggest infection.  Rare bacteria but some squamous cells suggesting possible contamination.  No symptoms of dysuria.  Discussed trying short course of ibuprofen along with patient's previously prescribed hydrocodone.  Patient reports that she did not try this today. Will discharge patient to home. All questions answered. Patient comfortable with plan of discharge. Return precautions discussed with patient and specified on the after visit summary.  [WS]    Clinical Course User Index [WS] Truett Mainland, Livingston Diones, MD     Additional history obtained: -Additional history obtained from family -External records from outside source obtained and reviewed including: Chart review including previous notes, labs, imaging, consultation notes   Lab Tests: -I ordered, reviewed, and interpreted labs.   The pertinent results include:   Labs Reviewed  URINALYSIS, ROUTINE W REFLEX MICROSCOPIC - Abnormal; Notable for the following components:      Result Value   Hgb urine dipstick MODERATE (*)    All other components  within normal limits  BASIC METABOLIC PANEL - Abnormal; Notable for the following components:   Potassium 3.1 (*)    Glucose, Bld 122 (*)    Creatinine, Ser 1.10 (*)    GFR, Estimated 54 (*)    All other components within normal limits  URINALYSIS, MICROSCOPIC (REFLEX) - Abnormal; Notable for the following components:   Bacteria, UA RARE (*)    All other components within normal limits  CBC      EKG   EKG Interpretation  Date/Time:    Ventricular Rate:    PR Interval:    QRS Duration:   QT Interval:    QTC Calculation:   R Axis:     Text Interpretation:           Imaging Studies ordered: I reviewed CT scan performed yesterday. I independently visualized and interpreted imaging. I agree with the radiologist interpretation   Medicines ordered and prescription drug management: Meds ordered this encounter  Medications   ondansetron (ZOFRAN) injection 4 mg   sodium chloride 0.9 % bolus 1,000 mL   ketorolac (TORADOL) 15 MG/ML injection 15 mg   potassium chloride (KLOR-CON) packet 40 mEq   ketorolac (TORADOL) 15 MG/ML injection 15 mg   sodium chloride 0.9 % bolus 1,000 mL   DISCONTD: sodium chloride 0.9 % bolus 1,000 mL   oxyCODONE (Oxy IR/ROXICODONE) immediate release tablet 5 mg   ondansetron (ZOFRAN) 4 MG tablet    Sig: Take 1 tablet (4 mg total) by mouth every 6 (six) hours.    Dispense:  12 tablet    Refill:  0    -I have reviewed the patients home medicines and have made adjustments as needed  Cardiac Monitoring: The patient was maintained on a cardiac monitor.  I personally viewed and interpreted the cardiac monitored which showed an underlying rhythm of: NSR   Reevaluation: After the interventions noted above, I reevaluated the patient and found that they have improved  Co morbidities that complicate the patient evaluation  Past Medical History:  Diagnosis Date   Acne rosacea    Arthritis    Back pain    Chronic headaches    Colon polyp,  hyperplastic    Diverticulitis    Esophageal stricture    GERD (gastroesophageal reflux disease)    Hiatal hernia    History of shingles 2010   HLD (hyperlipidemia)    IBS (irritable bowel syndrome)    OA (osteoarthritis) of knee    bilateral   OP (osteoporosis)  Seasonal allergies    Status post dilation of esophageal narrowing       Dispostion: Discharge    Final Clinical Impression(s) / ED Diagnoses Final diagnoses:  Ureteral colic     This chart was dictated using voice recognition software.  Despite best efforts to proofread,  errors can occur which can change the documentation meaning.    Cristie Hem, MD 06/08/22 1120

## 2022-06-07 NOTE — ED Notes (Signed)
Pt up to BR, tolerated well, Urine output of 757m, clear and pale yellow, sample sent to lab

## 2022-06-07 NOTE — ED Triage Notes (Signed)
Patient c/o left flank pain x today - Patient states she currently has 3 stones and the pain started again today.

## 2022-06-07 NOTE — Discharge Instructions (Signed)
We evaluated you for your left flank pain.  Your evaluation was reassuring.  Your urinalysis did not show signs of a kidney infection.  Your symptoms are consistent with the CT scan which was performed, that showed a 3 to 4 mm kidney stone.  This is small enough that it should pass on its own.  Please continue to take tamsulosin daily.  If you need to, please take ibuprofen 600 mg every day, for up to a week.  Please drink lots of fluids.  Please also take your prescribed hydrocodone every 6 hours as needed.  Follow-up with your primary care doctor.  If your pain is severe, you develop vomiting, fevers, painful urination, increased flank pain, or any other concerning symptoms, please return immediately to the emergency department.

## 2022-06-10 DIAGNOSIS — N201 Calculus of ureter: Secondary | ICD-10-CM | POA: Diagnosis not present

## 2022-06-11 ENCOUNTER — Other Ambulatory Visit: Payer: Self-pay

## 2022-06-11 ENCOUNTER — Encounter (HOSPITAL_BASED_OUTPATIENT_CLINIC_OR_DEPARTMENT_OTHER): Payer: Self-pay | Admitting: Urology

## 2022-06-11 ENCOUNTER — Other Ambulatory Visit: Payer: Self-pay | Admitting: Urology

## 2022-06-11 NOTE — Progress Notes (Signed)
Spoke w/ via phone for pre-op interview---Tammy Cooley needs dos----none               Cooley results------06/07/22 cbc, bmp in Epic COVID test -----patient states asymptomatic no test needed Arrive at -------0630 on Wednesday, 06/12/22 NPO after MN NO Solid Food.  Clear liquids from MN until---0530 Med rec completed Medications to take morning of surgery -----none Diabetic medication -----n/a Patient instructed no nail polish to be worn day of surgery Patient instructed to bring photo id and insurance card day of surgery Patient aware to have Driver (ride ) / caregiver    for 24 hours after surgery - husband, Dance movement psychotherapist Patient Special Instructions -----none Pre-Op special Istructions -----none Patient verbalized understanding of instructions that were given at this phone interview. Patient denies shortness of breath, chest pain, fever, cough at this phone interview.

## 2022-06-12 ENCOUNTER — Ambulatory Visit (HOSPITAL_BASED_OUTPATIENT_CLINIC_OR_DEPARTMENT_OTHER)
Admission: RE | Admit: 2022-06-12 | Discharge: 2022-06-12 | Disposition: A | Discharge status: Home / Self Care | Payer: Medicare HMO | Attending: Urology | Admitting: Urology

## 2022-06-12 ENCOUNTER — Ambulatory Visit (HOSPITAL_BASED_OUTPATIENT_CLINIC_OR_DEPARTMENT_OTHER): Payer: Medicare HMO | Admitting: Anesthesiology

## 2022-06-12 ENCOUNTER — Encounter (HOSPITAL_BASED_OUTPATIENT_CLINIC_OR_DEPARTMENT_OTHER)
Admission: RE | Disposition: A | Discharge status: Home / Self Care | Payer: Self-pay | Source: Home / Self Care | Attending: Urology

## 2022-06-12 ENCOUNTER — Encounter (HOSPITAL_BASED_OUTPATIENT_CLINIC_OR_DEPARTMENT_OTHER): Payer: Self-pay | Admitting: Urology

## 2022-06-12 ENCOUNTER — Other Ambulatory Visit: Payer: Self-pay

## 2022-06-12 DIAGNOSIS — Z87442 Personal history of urinary calculi: Secondary | ICD-10-CM | POA: Diagnosis not present

## 2022-06-12 DIAGNOSIS — Z049 Encounter for examination and observation for unspecified reason: Secondary | ICD-10-CM | POA: Insufficient documentation

## 2022-06-12 DIAGNOSIS — Z538 Procedure and treatment not carried out for other reasons: Secondary | ICD-10-CM | POA: Diagnosis not present

## 2022-06-12 DIAGNOSIS — N200 Calculus of kidney: Secondary | ICD-10-CM | POA: Diagnosis not present

## 2022-06-12 DIAGNOSIS — Z01818 Encounter for other preprocedural examination: Secondary | ICD-10-CM

## 2022-06-12 HISTORY — DX: Family history of other specified conditions: Z84.89

## 2022-06-12 HISTORY — DX: Other specified postprocedural states: Z98.890

## 2022-06-12 HISTORY — DX: Personal history of urinary calculi: Z87.442

## 2022-06-12 SURGERY — CYSTOSCOPY/URETEROSCOPY/HOLMIUM LASER/STENT PLACEMENT
Anesthesia: General

## 2022-06-12 MED ORDER — DEXAMETHASONE SODIUM PHOSPHATE 10 MG/ML IJ SOLN
INTRAMUSCULAR | Status: AC
  Filled 2022-06-12: qty 1

## 2022-06-12 MED ORDER — ONDANSETRON HCL 4 MG/2ML IJ SOLN
INTRAMUSCULAR | Status: AC
  Filled 2022-06-12: qty 2

## 2022-06-12 MED ORDER — CEFAZOLIN SODIUM-DEXTROSE 2-4 GM/100ML-% IV SOLN: 2.0000 g | INTRAVENOUS | Status: DC

## 2022-06-12 MED ORDER — CEFAZOLIN SODIUM-DEXTROSE 2-4 GM/100ML-% IV SOLN
INTRAVENOUS | Status: AC
  Filled 2022-06-12: qty 100

## 2022-06-12 MED ORDER — MIDAZOLAM HCL 2 MG/2ML IJ SOLN
INTRAMUSCULAR | Status: AC
  Filled 2022-06-12: qty 2

## 2022-06-12 MED ORDER — FENTANYL CITRATE (PF) 100 MCG/2ML IJ SOLN
INTRAMUSCULAR | Status: AC
  Filled 2022-06-12: qty 2

## 2022-06-12 MED ORDER — LIDOCAINE HCL (PF) 2 % IJ SOLN
INTRAMUSCULAR | Status: AC
  Filled 2022-06-12: qty 5

## 2022-06-12 MED ORDER — PROPOFOL 10 MG/ML IV BOLUS
INTRAVENOUS | Status: AC
  Filled 2022-06-12: qty 20

## 2022-06-12 MED ORDER — LACTATED RINGERS IV SOLN: INTRAVENOUS | Status: DC

## 2022-06-12 MED ORDER — ARTIFICIAL TEARS OPHTHALMIC OINT
TOPICAL_OINTMENT | OPHTHALMIC | Status: AC
  Filled 2022-06-12: qty 3.5

## 2022-06-12 SURGICAL SUPPLY — 27 items
BAG DRAIN URO-CYSTO SKYTR STRL (DRAIN) ×1 IMPLANT
BAG DRN UROCATH (DRAIN)
BASKET ZERO TIP NITINOL 2.4FR (BASKET) IMPLANT
BSKT STON RTRVL ZERO TP 2.4FR (BASKET)
CATH URETL OPEN END 6FR 70 (CATHETERS) ×1 IMPLANT
CLOTH BEACON ORANGE TIMEOUT ST (SAFETY) ×1 IMPLANT
COVER DOME SNAP 22 D (MISCELLANEOUS) ×1 IMPLANT
ELECT REM PT RETURN 9FT ADLT (ELECTROSURGICAL)
ELECTRODE REM PT RTRN 9FT ADLT (ELECTROSURGICAL) IMPLANT
FIBER LASER FLEXIVA 365 (UROLOGICAL SUPPLIES) IMPLANT
GLOVE BIO SURGEON STRL SZ7 (GLOVE) ×1 IMPLANT
GLOVE BIO SURGEON STRL SZ8 (GLOVE) ×1 IMPLANT
GLOVE BIOGEL PI IND STRL 6.5 (GLOVE) IMPLANT
GLOVE ECLIPSE 6.5 STRL STRAW (GLOVE) IMPLANT
GOWN STRL REUS W/TWL LRG LVL3 (GOWN DISPOSABLE) ×1 IMPLANT
GUIDEWIRE ANG ZIPWIRE 038X150 (WIRE) IMPLANT
GUIDEWIRE STR DUAL SENSOR (WIRE) IMPLANT
IV NS IRRIG 3000ML ARTHROMATIC (IV SOLUTION) ×2 IMPLANT
KIT TURNOVER CYSTO (KITS) ×1 IMPLANT
MANIFOLD NEPTUNE II (INSTRUMENTS) ×1 IMPLANT
NS IRRIG 500ML POUR BTL (IV SOLUTION) ×1 IMPLANT
PACK CYSTO (CUSTOM PROCEDURE TRAY) ×1 IMPLANT
SHEATH NAVIGATOR HD 11/13X36 (SHEATH) IMPLANT
TRACTIP FLEXIVA PULS ID 200XHI (Laser) IMPLANT
TRACTIP FLEXIVA PULSE ID 200 (Laser)
TUBE CONNECTING 12X1/4 (SUCTIONS) IMPLANT
TUBING UROLOGY SET (TUBING) IMPLANT

## 2022-06-12 NOTE — H&P (Signed)
On my preoperative evaluation today, Ms Tammy Cooley reports she passed the stone last night and had immediate improvement in her flank pain. She has the stone with her today. She does endorse some suprapubic discomfort and urinary urgency.  We discussed options and I think it would be most prudent to cancel the case. I will prescribe her antibiotics as it sounds like she has a UTI, and a urine culture from her outpatient visit with me Monday is currently pending.   She will call with any issues otherwise will f/u with me as previously scheduled

## 2022-06-12 NOTE — Anesthesia Preprocedure Evaluation (Signed)
Anesthesia Evaluation  Patient identified by MRN, date of birth, ID band Patient awake    Reviewed: Allergy & Precautions, H&P , NPO status , Patient's Chart, lab work & pertinent test results  History of Anesthesia Complications (+) PONV and history of anesthetic complications  Airway Mallampati: II   Neck ROM: full    Dental   Pulmonary neg pulmonary ROS,    breath sounds clear to auscultation       Cardiovascular negative cardio ROS   Rhythm:regular Rate:Normal     Neuro/Psych  Headaches,    GI/Hepatic GERD  ,H/o esophageal stricture   Endo/Other    Renal/GU stones     Musculoskeletal  (+) Arthritis ,   Abdominal   Peds  Hematology   Anesthesia Other Findings   Reproductive/Obstetrics                             Anesthesia Physical Anesthesia Plan  ASA: 2  Anesthesia Plan: General   Post-op Pain Management:    Induction: Intravenous  PONV Risk Score and Plan: 4 or greater and Ondansetron, Dexamethasone, Midazolam and Treatment may vary due to age or medical condition  Airway Management Planned: LMA  Additional Equipment:   Intra-op Plan:   Post-operative Plan: Extubation in OR  Informed Consent: I have reviewed the patients History and Physical, chart, labs and discussed the procedure including the risks, benefits and alternatives for the proposed anesthesia with the patient or authorized representative who has indicated his/her understanding and acceptance.     Dental advisory given  Plan Discussed with: CRNA, Anesthesiologist and Surgeon  Anesthesia Plan Comments:         Anesthesia Quick Evaluation

## 2022-07-11 DIAGNOSIS — Z6824 Body mass index (BMI) 24.0-24.9, adult: Secondary | ICD-10-CM | POA: Diagnosis not present

## 2022-07-11 DIAGNOSIS — R948 Abnormal results of function studies of other organs and systems: Secondary | ICD-10-CM | POA: Diagnosis not present

## 2022-07-11 DIAGNOSIS — E559 Vitamin D deficiency, unspecified: Secondary | ICD-10-CM | POA: Diagnosis not present

## 2022-07-11 DIAGNOSIS — N200 Calculus of kidney: Secondary | ICD-10-CM | POA: Diagnosis not present

## 2022-07-19 DIAGNOSIS — R300 Dysuria: Secondary | ICD-10-CM | POA: Diagnosis not present

## 2022-07-25 DIAGNOSIS — N200 Calculus of kidney: Secondary | ICD-10-CM | POA: Diagnosis not present

## 2022-08-14 DIAGNOSIS — R8290 Unspecified abnormal findings in urine: Secondary | ICD-10-CM | POA: Diagnosis not present

## 2022-08-14 DIAGNOSIS — M810 Age-related osteoporosis without current pathological fracture: Secondary | ICD-10-CM | POA: Diagnosis not present

## 2022-08-14 DIAGNOSIS — N951 Menopausal and female climacteric states: Secondary | ICD-10-CM | POA: Diagnosis not present

## 2022-08-14 DIAGNOSIS — Z01419 Encounter for gynecological examination (general) (routine) without abnormal findings: Secondary | ICD-10-CM | POA: Diagnosis not present

## 2022-08-15 ENCOUNTER — Other Ambulatory Visit: Payer: Self-pay | Admitting: Obstetrics and Gynecology

## 2022-08-15 DIAGNOSIS — M810 Age-related osteoporosis without current pathological fracture: Secondary | ICD-10-CM

## 2022-09-03 DIAGNOSIS — L433 Subacute (active) lichen planus: Secondary | ICD-10-CM | POA: Diagnosis not present

## 2022-09-03 DIAGNOSIS — C44729 Squamous cell carcinoma of skin of left lower limb, including hip: Secondary | ICD-10-CM | POA: Diagnosis not present

## 2022-10-07 DIAGNOSIS — C44722 Squamous cell carcinoma of skin of right lower limb, including hip: Secondary | ICD-10-CM | POA: Diagnosis not present

## 2022-10-07 DIAGNOSIS — Z85828 Personal history of other malignant neoplasm of skin: Secondary | ICD-10-CM | POA: Diagnosis not present

## 2022-10-07 DIAGNOSIS — C44729 Squamous cell carcinoma of skin of left lower limb, including hip: Secondary | ICD-10-CM | POA: Diagnosis not present

## 2022-10-07 DIAGNOSIS — Z79899 Other long term (current) drug therapy: Secondary | ICD-10-CM | POA: Diagnosis not present

## 2022-10-10 DIAGNOSIS — E7800 Pure hypercholesterolemia, unspecified: Secondary | ICD-10-CM | POA: Diagnosis not present

## 2022-10-10 DIAGNOSIS — R238 Other skin changes: Secondary | ICD-10-CM | POA: Diagnosis not present

## 2022-10-10 DIAGNOSIS — Z6824 Body mass index (BMI) 24.0-24.9, adult: Secondary | ICD-10-CM | POA: Diagnosis not present

## 2022-10-10 DIAGNOSIS — R948 Abnormal results of function studies of other organs and systems: Secondary | ICD-10-CM | POA: Diagnosis not present

## 2022-10-18 DIAGNOSIS — U071 COVID-19: Secondary | ICD-10-CM | POA: Diagnosis not present

## 2022-10-28 DIAGNOSIS — L433 Subacute (active) lichen planus: Secondary | ICD-10-CM | POA: Diagnosis not present

## 2022-10-28 DIAGNOSIS — C44722 Squamous cell carcinoma of skin of right lower limb, including hip: Secondary | ICD-10-CM | POA: Diagnosis not present

## 2022-10-28 DIAGNOSIS — D485 Neoplasm of uncertain behavior of skin: Secondary | ICD-10-CM | POA: Diagnosis not present

## 2022-10-28 DIAGNOSIS — C44729 Squamous cell carcinoma of skin of left lower limb, including hip: Secondary | ICD-10-CM | POA: Diagnosis not present

## 2022-11-27 DIAGNOSIS — C44729 Squamous cell carcinoma of skin of left lower limb, including hip: Secondary | ICD-10-CM | POA: Diagnosis not present

## 2022-11-27 DIAGNOSIS — C44722 Squamous cell carcinoma of skin of right lower limb, including hip: Secondary | ICD-10-CM | POA: Diagnosis not present

## 2022-11-27 DIAGNOSIS — D485 Neoplasm of uncertain behavior of skin: Secondary | ICD-10-CM | POA: Diagnosis not present

## 2022-12-19 DIAGNOSIS — E7800 Pure hypercholesterolemia, unspecified: Secondary | ICD-10-CM | POA: Diagnosis not present

## 2022-12-19 DIAGNOSIS — Z6823 Body mass index (BMI) 23.0-23.9, adult: Secondary | ICD-10-CM | POA: Diagnosis not present

## 2022-12-19 DIAGNOSIS — K219 Gastro-esophageal reflux disease without esophagitis: Secondary | ICD-10-CM | POA: Diagnosis not present

## 2022-12-19 DIAGNOSIS — E538 Deficiency of other specified B group vitamins: Secondary | ICD-10-CM | POA: Diagnosis not present

## 2022-12-19 DIAGNOSIS — Z0000 Encounter for general adult medical examination without abnormal findings: Secondary | ICD-10-CM | POA: Diagnosis not present

## 2022-12-26 DIAGNOSIS — L570 Actinic keratosis: Secondary | ICD-10-CM | POA: Diagnosis not present

## 2022-12-26 DIAGNOSIS — C44729 Squamous cell carcinoma of skin of left lower limb, including hip: Secondary | ICD-10-CM | POA: Diagnosis not present

## 2023-01-03 DIAGNOSIS — Z6825 Body mass index (BMI) 25.0-25.9, adult: Secondary | ICD-10-CM | POA: Diagnosis not present

## 2023-01-03 DIAGNOSIS — R350 Frequency of micturition: Secondary | ICD-10-CM | POA: Diagnosis not present

## 2023-01-29 DIAGNOSIS — H2513 Age-related nuclear cataract, bilateral: Secondary | ICD-10-CM | POA: Diagnosis not present

## 2023-01-29 DIAGNOSIS — H04123 Dry eye syndrome of bilateral lacrimal glands: Secondary | ICD-10-CM | POA: Diagnosis not present

## 2023-02-10 DIAGNOSIS — Z85828 Personal history of other malignant neoplasm of skin: Secondary | ICD-10-CM | POA: Diagnosis not present

## 2023-02-10 DIAGNOSIS — Z79899 Other long term (current) drug therapy: Secondary | ICD-10-CM | POA: Diagnosis not present

## 2023-02-10 DIAGNOSIS — L309 Dermatitis, unspecified: Secondary | ICD-10-CM | POA: Diagnosis not present

## 2023-02-11 DIAGNOSIS — Z6824 Body mass index (BMI) 24.0-24.9, adult: Secondary | ICD-10-CM | POA: Diagnosis not present

## 2023-02-11 DIAGNOSIS — J0140 Acute pansinusitis, unspecified: Secondary | ICD-10-CM | POA: Diagnosis not present

## 2023-02-18 ENCOUNTER — Other Ambulatory Visit: Payer: Self-pay | Admitting: Obstetrics and Gynecology

## 2023-02-18 DIAGNOSIS — M810 Age-related osteoporosis without current pathological fracture: Secondary | ICD-10-CM

## 2023-02-21 ENCOUNTER — Other Ambulatory Visit: Payer: Medicare HMO

## 2023-02-27 DIAGNOSIS — Z1231 Encounter for screening mammogram for malignant neoplasm of breast: Secondary | ICD-10-CM | POA: Diagnosis not present

## 2023-03-13 DIAGNOSIS — Z6822 Body mass index (BMI) 22.0-22.9, adult: Secondary | ICD-10-CM | POA: Diagnosis not present

## 2023-03-13 DIAGNOSIS — E7800 Pure hypercholesterolemia, unspecified: Secondary | ICD-10-CM | POA: Diagnosis not present

## 2023-03-13 DIAGNOSIS — K219 Gastro-esophageal reflux disease without esophagitis: Secondary | ICD-10-CM | POA: Diagnosis not present

## 2023-03-13 DIAGNOSIS — R948 Abnormal results of function studies of other organs and systems: Secondary | ICD-10-CM | POA: Diagnosis not present

## 2023-04-17 DIAGNOSIS — D485 Neoplasm of uncertain behavior of skin: Secondary | ICD-10-CM | POA: Diagnosis not present

## 2023-04-17 DIAGNOSIS — Z85828 Personal history of other malignant neoplasm of skin: Secondary | ICD-10-CM | POA: Diagnosis not present

## 2023-04-17 DIAGNOSIS — L570 Actinic keratosis: Secondary | ICD-10-CM | POA: Diagnosis not present

## 2023-04-17 DIAGNOSIS — L309 Dermatitis, unspecified: Secondary | ICD-10-CM | POA: Diagnosis not present

## 2023-04-17 DIAGNOSIS — L905 Scar conditions and fibrosis of skin: Secondary | ICD-10-CM | POA: Diagnosis not present

## 2023-04-29 DIAGNOSIS — H25043 Posterior subcapsular polar age-related cataract, bilateral: Secondary | ICD-10-CM | POA: Diagnosis not present

## 2023-04-29 DIAGNOSIS — H18413 Arcus senilis, bilateral: Secondary | ICD-10-CM | POA: Diagnosis not present

## 2023-04-29 DIAGNOSIS — H25013 Cortical age-related cataract, bilateral: Secondary | ICD-10-CM | POA: Diagnosis not present

## 2023-04-29 DIAGNOSIS — H2513 Age-related nuclear cataract, bilateral: Secondary | ICD-10-CM | POA: Diagnosis not present

## 2023-06-04 DIAGNOSIS — R948 Abnormal results of function studies of other organs and systems: Secondary | ICD-10-CM | POA: Diagnosis not present

## 2023-06-04 DIAGNOSIS — E538 Deficiency of other specified B group vitamins: Secondary | ICD-10-CM | POA: Diagnosis not present

## 2023-06-04 DIAGNOSIS — Z6823 Body mass index (BMI) 23.0-23.9, adult: Secondary | ICD-10-CM | POA: Diagnosis not present

## 2023-06-04 DIAGNOSIS — E7800 Pure hypercholesterolemia, unspecified: Secondary | ICD-10-CM | POA: Diagnosis not present

## 2023-06-23 DIAGNOSIS — H2513 Age-related nuclear cataract, bilateral: Secondary | ICD-10-CM | POA: Diagnosis not present

## 2023-06-23 DIAGNOSIS — H25043 Posterior subcapsular polar age-related cataract, bilateral: Secondary | ICD-10-CM | POA: Diagnosis not present

## 2023-06-23 DIAGNOSIS — H18413 Arcus senilis, bilateral: Secondary | ICD-10-CM | POA: Diagnosis not present

## 2023-06-23 DIAGNOSIS — H04123 Dry eye syndrome of bilateral lacrimal glands: Secondary | ICD-10-CM | POA: Diagnosis not present

## 2023-06-23 DIAGNOSIS — H2512 Age-related nuclear cataract, left eye: Secondary | ICD-10-CM | POA: Diagnosis not present

## 2023-07-16 DIAGNOSIS — L570 Actinic keratosis: Secondary | ICD-10-CM | POA: Diagnosis not present

## 2023-07-16 DIAGNOSIS — L565 Disseminated superficial actinic porokeratosis (DSAP): Secondary | ICD-10-CM | POA: Diagnosis not present

## 2023-07-16 DIAGNOSIS — D485 Neoplasm of uncertain behavior of skin: Secondary | ICD-10-CM | POA: Diagnosis not present

## 2023-07-16 DIAGNOSIS — D0472 Carcinoma in situ of skin of left lower limb, including hip: Secondary | ICD-10-CM | POA: Diagnosis not present

## 2023-07-24 DIAGNOSIS — R350 Frequency of micturition: Secondary | ICD-10-CM | POA: Diagnosis not present

## 2023-07-24 DIAGNOSIS — N3001 Acute cystitis with hematuria: Secondary | ICD-10-CM | POA: Diagnosis not present

## 2023-07-24 DIAGNOSIS — L03116 Cellulitis of left lower limb: Secondary | ICD-10-CM | POA: Diagnosis not present

## 2023-07-24 DIAGNOSIS — L03115 Cellulitis of right lower limb: Secondary | ICD-10-CM | POA: Diagnosis not present

## 2023-07-29 DIAGNOSIS — H25041 Posterior subcapsular polar age-related cataract, right eye: Secondary | ICD-10-CM | POA: Diagnosis not present

## 2023-07-29 DIAGNOSIS — H2511 Age-related nuclear cataract, right eye: Secondary | ICD-10-CM | POA: Diagnosis not present

## 2023-07-29 DIAGNOSIS — H2512 Age-related nuclear cataract, left eye: Secondary | ICD-10-CM | POA: Diagnosis not present

## 2023-07-29 DIAGNOSIS — H269 Unspecified cataract: Secondary | ICD-10-CM | POA: Diagnosis not present

## 2023-08-05 DIAGNOSIS — H2511 Age-related nuclear cataract, right eye: Secondary | ICD-10-CM | POA: Diagnosis not present

## 2023-08-05 DIAGNOSIS — H269 Unspecified cataract: Secondary | ICD-10-CM | POA: Diagnosis not present

## 2023-08-27 DIAGNOSIS — Z1231 Encounter for screening mammogram for malignant neoplasm of breast: Secondary | ICD-10-CM

## 2023-09-10 ENCOUNTER — Inpatient Hospital Stay: Admission: RE | Admit: 2023-09-10 | Payer: Medicare HMO | Source: Ambulatory Visit

## 2023-09-10 ENCOUNTER — Ambulatory Visit
Admission: RE | Admit: 2023-09-10 | Discharge: 2023-09-10 | Disposition: A | Discharge status: Home / Self Care | Payer: Medicare HMO | Source: Ambulatory Visit | Attending: Internal Medicine | Admitting: Internal Medicine

## 2023-09-10 ENCOUNTER — Ambulatory Visit
Admission: RE | Admit: 2023-09-10 | Discharge: 2023-09-10 | Disposition: A | Discharge status: Home / Self Care | Payer: Medicare HMO | Source: Ambulatory Visit | Attending: Obstetrics and Gynecology | Admitting: Obstetrics and Gynecology

## 2023-09-10 DIAGNOSIS — M8588 Other specified disorders of bone density and structure, other site: Secondary | ICD-10-CM | POA: Diagnosis not present

## 2023-09-10 DIAGNOSIS — M810 Age-related osteoporosis without current pathological fracture: Secondary | ICD-10-CM

## 2023-09-10 DIAGNOSIS — E2839 Other primary ovarian failure: Secondary | ICD-10-CM | POA: Diagnosis not present

## 2023-09-10 DIAGNOSIS — Z1231 Encounter for screening mammogram for malignant neoplasm of breast: Secondary | ICD-10-CM

## 2023-09-10 DIAGNOSIS — N958 Other specified menopausal and perimenopausal disorders: Secondary | ICD-10-CM | POA: Diagnosis not present

## 2023-09-10 DIAGNOSIS — Z90722 Acquired absence of ovaries, bilateral: Secondary | ICD-10-CM | POA: Diagnosis not present

## 2023-10-20 DIAGNOSIS — D485 Neoplasm of uncertain behavior of skin: Secondary | ICD-10-CM | POA: Diagnosis not present

## 2023-10-20 DIAGNOSIS — Z85828 Personal history of other malignant neoplasm of skin: Secondary | ICD-10-CM | POA: Diagnosis not present

## 2023-10-20 DIAGNOSIS — C44529 Squamous cell carcinoma of skin of other part of trunk: Secondary | ICD-10-CM | POA: Diagnosis not present

## 2023-10-20 DIAGNOSIS — L6611 Classic lichen planopilaris: Secondary | ICD-10-CM | POA: Diagnosis not present

## 2023-10-20 DIAGNOSIS — L821 Other seborrheic keratosis: Secondary | ICD-10-CM | POA: Diagnosis not present

## 2023-10-20 DIAGNOSIS — C44729 Squamous cell carcinoma of skin of left lower limb, including hip: Secondary | ICD-10-CM | POA: Diagnosis not present

## 2023-10-20 DIAGNOSIS — L905 Scar conditions and fibrosis of skin: Secondary | ICD-10-CM | POA: Diagnosis not present

## 2023-10-28 ENCOUNTER — Other Ambulatory Visit (HOSPITAL_COMMUNITY): Payer: Self-pay | Admitting: Family Medicine

## 2023-10-28 DIAGNOSIS — E7800 Pure hypercholesterolemia, unspecified: Secondary | ICD-10-CM

## 2023-10-28 DIAGNOSIS — Z8249 Family history of ischemic heart disease and other diseases of the circulatory system: Secondary | ICD-10-CM | POA: Diagnosis not present

## 2023-10-28 DIAGNOSIS — Z23 Encounter for immunization: Secondary | ICD-10-CM | POA: Diagnosis not present

## 2023-10-28 DIAGNOSIS — Z6823 Body mass index (BMI) 23.0-23.9, adult: Secondary | ICD-10-CM | POA: Diagnosis not present

## 2023-10-30 DIAGNOSIS — C44729 Squamous cell carcinoma of skin of left lower limb, including hip: Secondary | ICD-10-CM | POA: Diagnosis not present

## 2023-10-31 ENCOUNTER — Ambulatory Visit (HOSPITAL_COMMUNITY): Payer: Medicare HMO

## 2023-11-03 DIAGNOSIS — R0981 Nasal congestion: Secondary | ICD-10-CM | POA: Diagnosis not present

## 2023-11-03 DIAGNOSIS — J111 Influenza due to unidentified influenza virus with other respiratory manifestations: Secondary | ICD-10-CM | POA: Diagnosis not present

## 2023-11-03 DIAGNOSIS — R051 Acute cough: Secondary | ICD-10-CM | POA: Diagnosis not present

## 2023-11-03 DIAGNOSIS — Z03818 Encounter for observation for suspected exposure to other biological agents ruled out: Secondary | ICD-10-CM | POA: Diagnosis not present

## 2023-11-04 ENCOUNTER — Other Ambulatory Visit (HOSPITAL_COMMUNITY): Payer: Medicare HMO

## 2023-11-11 ENCOUNTER — Ambulatory Visit (HOSPITAL_COMMUNITY)
Admission: RE | Admit: 2023-11-11 | Discharge: 2023-11-11 | Disposition: A | Discharge status: Home / Self Care | Payer: Self-pay | Source: Ambulatory Visit | Attending: Family Medicine | Admitting: Family Medicine

## 2023-11-11 DIAGNOSIS — E7800 Pure hypercholesterolemia, unspecified: Secondary | ICD-10-CM | POA: Insufficient documentation

## 2023-11-18 ENCOUNTER — Encounter: Payer: Self-pay | Admitting: Obstetrics and Gynecology

## 2023-11-20 DIAGNOSIS — M810 Age-related osteoporosis without current pathological fracture: Secondary | ICD-10-CM | POA: Diagnosis not present

## 2023-11-20 DIAGNOSIS — R300 Dysuria: Secondary | ICD-10-CM | POA: Diagnosis not present

## 2024-01-27 ENCOUNTER — Ambulatory Visit (HOSPITAL_BASED_OUTPATIENT_CLINIC_OR_DEPARTMENT_OTHER): Payer: Medicare HMO | Admitting: Cardiology

## 2024-02-10 DIAGNOSIS — E538 Deficiency of other specified B group vitamins: Secondary | ICD-10-CM | POA: Diagnosis not present

## 2024-02-10 DIAGNOSIS — Z0000 Encounter for general adult medical examination without abnormal findings: Secondary | ICD-10-CM | POA: Diagnosis not present

## 2024-02-10 DIAGNOSIS — E7800 Pure hypercholesterolemia, unspecified: Secondary | ICD-10-CM | POA: Diagnosis not present

## 2024-02-10 DIAGNOSIS — M810 Age-related osteoporosis without current pathological fracture: Secondary | ICD-10-CM | POA: Diagnosis not present

## 2024-02-10 DIAGNOSIS — I700 Atherosclerosis of aorta: Secondary | ICD-10-CM | POA: Diagnosis not present

## 2024-02-10 DIAGNOSIS — Z23 Encounter for immunization: Secondary | ICD-10-CM | POA: Diagnosis not present

## 2024-03-01 DIAGNOSIS — Z1231 Encounter for screening mammogram for malignant neoplasm of breast: Secondary | ICD-10-CM | POA: Diagnosis not present

## 2024-03-19 ENCOUNTER — Other Ambulatory Visit: Payer: Self-pay | Admitting: Obstetrics and Gynecology

## 2024-03-29 DIAGNOSIS — E7800 Pure hypercholesterolemia, unspecified: Secondary | ICD-10-CM | POA: Diagnosis not present

## 2024-04-01 DIAGNOSIS — M810 Age-related osteoporosis without current pathological fracture: Secondary | ICD-10-CM | POA: Diagnosis not present

## 2024-04-12 NOTE — Progress Notes (Unsigned)
 HPI : 73 year old female, here for a follow-up visit for GERD, IBS/chronic diarrhea.  She was last seen in September 2022, recall remotely followed by Dr. Luis.  see intake note for full history.  Recall the last time I saw her was in September 2022 we performed an endoscopy and colonoscopy at that time.  Endoscopy showed a 3 cm hiatal hernia with a GEJ stricture that was dilated to 19 mm.  Her colonoscopy was normal, no overt inflammation, biopsies negative for microscopic colitis however tested positive for nonspecific inflammation.  There was no clear chronicity with this, pathology did not think consistent with IBD but potentially drug reaction?.  She tested negative for celiac disease.  She has not been doing well since she last saw me and is here to discuss several issues.  Her main concern is that of loose stools along with gas and abdominal cramping.  She will typically average 4-6 stools per day with soft stool form.  Often with urgency postprandially and can have significant cramping.  She has had some incontinence after eating due to this and does not eat at restaurants because of the symptoms.  She has on occasion woke up at night to move her bowels.  She thinks symptoms have gotten worse in recent years.  3 weeks ago she states she had some dark black stools and then it cleared up and resolved.  She has had some left lower quadrant discomfort on and off in recent weeks and in general has a lot of lower abdominal cramping that comes with her diarrhea.  Her gallbladder is in place.  Recall we had tried her previously on Colestid  empirically and Levsin .  At the time of her colonoscopy she states these regimens helped and her stomach was feeling better.  She states she stopped them over time at some point, unclear when she stopped them.  She lately has been taking Aleve upwards of 4 days a week for stomach pains.  She has been on this for some time.  We discussed other options to manage her  symptoms.  Otherwise she states she has had recurrent dysphagia to steak and chicken.  She also has worsening reflux with pyrosis and regurgitation.  She has been taking her omeprazole  roughly once per week.  When she was taking it more routinely it seemed to help although would not resolve it.  She does have osteoporosis.  She takes Tums as needed currently for her symptoms.  Prior workup: EGD 06/03/2017 - Dr. Luis - distal esophageal stricture dilated to 17mm and 18mm with Savory dilator. Possible EoE. Small hiatal hernia - biopsies negative for EoE   Colonoscopy 2014 - sigmoid diverticulosis, normal biopsies of the colon    EGD 06/28/21: - Esophagogastric landmarks were identified: the Z-line was found at 33 cm, the gastroesophageal junction was found at 33 cm and the upper extent of the gastric folds was found at 36 cm from the incisors. Findings: - A 3 cm hiatal hernia was present. - One benign-appearing, intrinsic mild stenosis was found 33 cm from the incisors. This stenosis measured less than one cm (in length). A TTS dilator was passed through the scope. Dilation with an 18-19-20 mm balloon dilator was performed to 18 mm and 19 mm after which an appropriate mucosal wrent was noted. - The exam of the esophagus was otherwise normal. - The entire examined stomach was normal. - The duodenal bulb and second portion of the duodenum were normal.   Colonoscopy 06/21/21: -  The perianal and digital rectal examinations were normal. - The terminal ileum appeared normal. - Multiple small-mouthed diverticula were found in the sigmoid colon. - The exam was otherwise without abnormality. - Biopsies for histology were taken with a cold forceps from the right colon, left colon and transverse colon for evaluation of microscopic colitis.   Surgical [P], colon nos, random sites - PATCHY ACUTE/ SUBACUTE NONSPECIFIC COLITIS. SEE NOTE - NEGATIVE FOR GRANULOMAS There is no evidence of increased intraepithelial  lymphocytes or thickened subepithelial collagen table. No definite features of chronicity are identified though evolving chronicity cannot be entirely ruled out. Based on findings and submitted biopsies, differential diagnosis mainly includes infection and drug-effect.   Past Medical History:  Diagnosis Date   Arthritis    knees   Back pain    left side hip and groin   Chronic headaches    has a hx of migraines, as of 06/11/22 patient only gets about twice a week   Colon polyp, hyperplastic    COVID-19 2022   Treated with Paxlovid   Diverticulitis 2022   Esophageal stricture    06/28/21 esophagus dilated, Dr. Elspeth Cooley @ [facility]   Family history of adverse reaction to anesthesia    Mom and sister have nausea and vomiting post-operatively.   GERD (gastroesophageal reflux disease)    Follows with Dr. Elspeth Cooley at [facility]   History of kidney stones    History of shingles 2010   HLD (hyperlipidemia)    Follows with Olam Pinal, MD @ [facility], lov 12/26/21.   Hypercholesteremia    IBS (irritable bowel syndrome)    OA (osteoarthritis) of knee    bilateral   OP (osteoporosis)    PONV (postoperative nausea and vomiting)    Seasonal allergies    Squamous cell carcinoma in situ (SCCIS) of skin    Status post dilation of esophageal narrowing 06/28/2021   Dr. Elspeth Cooley   Vitamin B 12 deficiency      Past Surgical History:  Procedure Laterality Date   ABDOMINAL HYSTERECTOMY     total 1999   ARTHRODESIS METATARSALPHALANGEAL JOINT (MTPJ) Left 09/17/2018   Procedure: left hallux metatarsal phalangeal joint arthrodesis and 2nd metatarsal Weil osteotomy;  Surgeon: Kit Rush, MD;  Location: [facility];  Service: Orthopedics;  Laterality: Left;    BUNIONECTOMY Left    CERVICAL FUSION     around 2008 or 2009   COLONOSCOPY W/ POLYPECTOMY  06/28/2021   with an EGD   FOOT SURGERY Left 2020   corrective surgery   left knee  arthroscopy     Dr. Duwayne   right knee arthroscopy     Dr. Baird   TONSILLECTOMY AND ADENOIDECTOMY  1971   Family History  Problem Relation Age of Onset   Breast cancer Mother    Colon polyps Mother    Alcoholism Father    Colon polyps Father    Heart disease Father    Diabetes Brother    Social History   Tobacco Use   Smoking status: Never   Smokeless tobacco: Never  Vaping Use   Vaping status: Never Used  Substance Use Topics   Alcohol use: No    Alcohol/week: 0.0 standard drinks of alcohol   Drug use: No   Current Outpatient Medications  Medication Sig Dispense Refill   ezetimibe (ZETIA) 10 MG tablet Take 10 mg by mouth daily.     loratadine (CLARITIN) 10 MG tablet Take 10 mg by  mouth as needed.     naproxen sodium (ALEVE) 220 MG tablet Take 220 mg by mouth daily as needed.     omeprazole  (PRILOSEC) 40 MG capsule Take 1 capsule (40 mg total) by mouth daily. (Patient taking differently: Take 40 mg by mouth as needed.) 30 capsule 1   No current facility-administered medications for this visit.   No Known Allergies   Review of Systems: All systems reviewed and negative except where noted in HPI.   No recent labs on file  Physical Exam: BP 122/80   Pulse 71   Ht 5' 2 (1.575 m)   Wt 126 lb 8 oz (57.4 kg)   BMI 23.14 kg/m  Constitutional: Pleasant,well-developed, female in no acute distress. Abdominal: Soft, nondistended, nontender. There are no masses palpable. No hepatomegaly. Neurological: Alert and oriented to person place and time. Skin: Skin is warm and dry. No rashes noted. Psychiatric: Normal mood and affect. Behavior is normal.   ASSESSMENT: 73 y.o. female here for assessment of the following  1. Chronic diarrhea   2. Lower abdominal pain   3. Gastroesophageal reflux disease, unspecified whether esophagitis present   4. Dysphagia, unspecified type    Multiple issues addressed today as outlined above.  She has chronic diarrhea, last colonoscopy  showed some nonspecific inflammatory changes but biopsies argued against typical IBD.  At the time of when I last saw her she was doing better on Levsin  and Colestid  so we decided to continue that.  Over time it sounds like she stopped this, unclear when, her symptoms have worsened as outlined above.  We discussed some options.  I think a fecal calprotectin will be useful here to see if she has any active inflammation in her colon.  If this is elevated I think she will need another colonoscopy to assess for IBD given results of her last exam.  She is agreeable with this.  She had basic labs done by her primary care, will inquire to get copies of this to make sure stable.  In the interim I recommend she stop all NSAIDs.  She is using frequent use of Aleve which can cause colitis/mimic IBD and confounding the presentation.  I recommend she stop all NSAIDs at this time.  I will resume Levsin  for her 3 times daily and to use as needed for her loose stools and abdominal pain as that had provided benefit in the past.  She does think Imodium has been able to constipate her in the past, I think reasonable to try a very low-dose half tab in the morning every morning and see how she does with this, can increase dose as needed.  Alternatively if it constipates her she can titrate down.  As for her reflux and dysphagia, she has a moderate size had a hernia and had a history of esophageal stricturing and suspect that is the cause of her symptoms.  Recommending an EGD to reevaluate and dilate stricture given this has provided benefit for her in the past.  In the interim recommend she go back on PPI.  She did not think omeprazole  helped too much, we will try her on Protonix 40 mg daily to start.  She can increase to twice daily as needed however counseled on long-term risks, she does have osteoporosis and long-term want to use lowest dose needed to control symptoms, however her symptoms are rather bothersome now and I think at  least for short-term would like to see how she does on it.  Moving  forward, I would like to see her back in 3 to 4 months for reassessment.  Has been too long since our last visit with really bothersome symptoms for her and need to sort out what is driving this and then tweak meds as appropriate moving forward to keep her feeling better.   PLAN: - lab today for fecal calprotectin. If elevated, recommend colonoscopy - get labs from Dr. Cleotilde - PCP - yearly physical labs make sure stable - stop all NSAIDs - start Levsin  0.125 TID and PRN - start immodium 1/2 tab in the AM and titrate up or down as needed - schedule EGD with dilation at [facility] - start protonix 40mg  / day, can increase to BID PRN. Counseled on risks, she does have osteoporosis - scheduled follow up in 3-4 months, but will be in touch sooner with further results / recommendations.  Tammy Naval, MD [facility]

## 2024-04-13 ENCOUNTER — Encounter: Payer: Self-pay | Admitting: Gastroenterology

## 2024-04-13 ENCOUNTER — Other Ambulatory Visit

## 2024-04-13 ENCOUNTER — Ambulatory Visit: Admitting: Gastroenterology

## 2024-04-13 VITALS — BP 122/80 | HR 71 | Ht 62.0 in | Wt 126.5 lb

## 2024-04-13 DIAGNOSIS — R1030 Lower abdominal pain, unspecified: Secondary | ICD-10-CM | POA: Diagnosis not present

## 2024-04-13 DIAGNOSIS — K219 Gastro-esophageal reflux disease without esophagitis: Secondary | ICD-10-CM | POA: Diagnosis not present

## 2024-04-13 DIAGNOSIS — K529 Noninfective gastroenteritis and colitis, unspecified: Secondary | ICD-10-CM | POA: Diagnosis not present

## 2024-04-13 DIAGNOSIS — R1310 Dysphagia, unspecified: Secondary | ICD-10-CM

## 2024-04-13 MED ORDER — HYOSCYAMINE SULFATE 0.125 MG SL SUBL: 0.1250 mg | SUBLINGUAL_TABLET | Freq: Three times a day (TID) | SUBLINGUAL | 2 refills | Status: AC

## 2024-04-13 MED ORDER — OMEPRAZOLE 40 MG PO CPDR: DELAYED_RELEASE_CAPSULE | ORAL | 2 refills | Status: AC

## 2024-04-13 NOTE — Patient Instructions (Addendum)
 Please go to the lab in the basement of our building to have lab work done as you leave today. Hit B for basement when you get on the elevator.  When the doors open the lab is on your left.  We will call you with the results. Thank you.  Stop all NSAIDs.  We have sent the following medications to your pharmacy for you to pick up at your convenience: Levsin : Take three times daily Protonix 40 mg: Take once to twice daily  Please purchase the following medications over the counter and take as directed Imodium:Take 1/2 tablet every morning and titrate as needed  You have been scheduled for an endoscopy. We will add you to the wait list in case anything opens up sooner. Please follow written instructions given to you at your visit today.  If you use inhalers (even only as needed), please bring them with you on the day of your procedure.  If you take any of the following medications, they will need to be adjusted prior to your procedure:   DO NOT TAKE 7 DAYS PRIOR TO TEST- Trulicity (dulaglutide) Ozempic, Wegovy (semaglutide) Mounjaro (tirzepatide) Bydureon Bcise (exanatide extended release)  DO NOT TAKE 1 DAY PRIOR TO YOUR TEST Rybelsus (semaglutide) Adlyxin (lixisenatide) Victoza (liraglutide) Byetta (exanatide) ___________________________________________________________________________   We will request your Labs from Dr. Dianne office.  Thank you for entrusting me with your care and for choosing [facility], Dr. Elspeth Naval    If your blood pressure at your visit was 140/90 or greater, please contact your primary care physician to follow up on this. ______________________________________________________  If you are age 71 or older, your body mass index should be between 23-30. Your Body mass index is 23.14 kg/m. If this is out of the aforementioned range listed, please consider follow up with your Primary Care Provider.  If you are age 71 or younger, your  body mass index should be between 19-25. Your Body mass index is 23.14 kg/m. If this is out of the aformentioned range listed, please consider follow up with your Primary Care Provider.  ________________________________________________________  The [facility] providers would like to encourage you to use MYCHART to communicate with providers for non-urgent requests or questions.  Due to long hold times on the telephone, sending your provider a message by [facility] may be a faster and more efficient way to get a response.  Please allow 48 business hours for a response.  Please remember that this is for non-urgent requests.  _______________________________________________________  Due to recent changes in healthcare laws, you may see the results of your imaging and laboratory studies on MyChart before your provider has had a chance to review them.  We understand that in some cases there may be results that are confusing or concerning to you. Not all laboratory results come back in the same time frame and the provider may be waiting for multiple results in order to interpret others.  Please give us  48 hours in order for your provider to thoroughly review all the results before contacting the office for clarification of your results.

## 2024-04-14 ENCOUNTER — Other Ambulatory Visit: Payer: Self-pay

## 2024-04-14 ENCOUNTER — Telehealth: Payer: Self-pay

## 2024-04-14 NOTE — Telephone Encounter (Signed)
 Dr. Okey, patient will be scheduled as soon as possible.  Auth Submission: NO AUTH NEEDED Site of care: Site of care: [facility] Payer: [employer] medicare Medication & CPT/J Code(s) submitted: Reclast (Zolendronic acid) [id number] Diagnosis Code:  Route of submission (phone, fax, portal): portal Phone # Fax # Auth type: Buy/Bill PB Units/visits requested: 5mg  x 1 dose Reference number:  Approval from: 04/14/24 to 09/29/24

## 2024-04-15 ENCOUNTER — Telehealth: Payer: Self-pay

## 2024-04-15 ENCOUNTER — Other Ambulatory Visit (HOSPITAL_COMMUNITY): Payer: Self-pay

## 2024-04-15 ENCOUNTER — Encounter: Payer: Self-pay | Admitting: Obstetrics and Gynecology

## 2024-04-15 NOTE — Telephone Encounter (Signed)
 Pharmacy Patient Advocate Encounter   Received notification from CoverMyMeds that prior authorization for Hyoscyamine  Sulfate 0.125MG  sublingual tablets is required/requested.   Insurance verification completed.   The patient is insured through [employer] [facility] [employer] .   Per test claim: PA required; PA submitted to above mentioned insurance via CoverMyMeds Key/confirmation #/EOC [id number] Status is pending

## 2024-04-15 NOTE — Telephone Encounter (Signed)
 Pharmacy Patient Advocate Encounter  Received notification from [employer] [facility] [employer] that Prior Authorization for Hyoscyamine  Sulfate 0.125MG  sublingual tablets has been DENIED.  Full denial letter will be uploaded to the media tab. See denial reason below.  We denied coverage for this drug because: Your plan's Medicare Part D drug plan cannot cover products not approved by the Food and Drug Administration (FDA). Your Medicare Part D drug plan was asked to cover the requested product. The requested product is not approved as a drug by the Food and Drug Administration (FDA). Products not approved as drugs by the FDA are excluded from coverage under the Medicare Part D drug benefit per Chapter 6, Section 10.1 of the Medicare Prescription Drug Benefit Manual. Since the requested product is excluded from medicare Part D coverage, the request for coverage under your Medicare Part D plan is denied.   PA #/Case ID/Reference #: [id number]

## 2024-04-15 NOTE — Telephone Encounter (Signed)
 MyChart message to patient to try GoodRx as medication is  inexpensive (around $15)

## 2024-04-20 ENCOUNTER — Other Ambulatory Visit: Payer: Self-pay

## 2024-04-29 DIAGNOSIS — E7800 Pure hypercholesterolemia, unspecified: Secondary | ICD-10-CM | POA: Diagnosis not present

## 2024-05-04 ENCOUNTER — Ambulatory Visit

## 2024-05-04 VITALS — BP 131/83 | HR 53 | Temp 97.5°F | Resp 12 | Ht 62.0 in | Wt 127.2 lb

## 2024-05-04 DIAGNOSIS — M810 Age-related osteoporosis without current pathological fracture: Secondary | ICD-10-CM | POA: Diagnosis not present

## 2024-05-04 MED ORDER — SODIUM CHLORIDE 0.9 % IV SOLN: INTRAVENOUS | Status: DC

## 2024-05-04 MED ORDER — DIPHENHYDRAMINE HCL 25 MG PO CAPS
25.0000 mg | ORAL_CAPSULE | Freq: Once | ORAL | Status: AC
  Administered 2024-05-04: 25 mg via ORAL
  Filled 2024-05-04: qty 1

## 2024-05-04 MED ORDER — ZOLEDRONIC ACID 5 MG/100ML IV SOLN
5.0000 mg | Freq: Once | INTRAVENOUS | Status: AC
Start: 2024-05-04 — End: 2024-05-04
  Administered 2024-05-04: 5 mg via INTRAVENOUS
  Filled 2024-05-04: qty 100

## 2024-05-04 MED ORDER — ACETAMINOPHEN 325 MG PO TABS
650.0000 mg | ORAL_TABLET | Freq: Once | ORAL | Status: AC
  Administered 2024-05-04: 650 mg via ORAL
  Filled 2024-05-04: qty 2

## 2024-05-04 NOTE — Progress Notes (Signed)
 Diagnosis: Osteoporosis  Provider:  Lonna Coder MD  Procedure: IV Infusion  IV Type: Peripheral, IV Location: L Antecubital  Reclast  (Zolendronic Acid), Dose: 5 mg  Infusion Start Time: 1505  Infusion Stop Time: 1537  Post Infusion IV Care: Observation period completed and Peripheral IV Discontinued  Discharge: Condition: Good, Destination: Home . AVS Provided  Performed by:  Leita FORBES Miles, LPN

## 2024-05-06 ENCOUNTER — Telehealth: Payer: Self-pay

## 2024-05-06 NOTE — Telephone Encounter (Signed)
 Left message for pt to call back regarding a sooner appt for procedure.  Left additional message for the pt to disregard the message as there were no appt available on 8/12.

## 2024-05-06 NOTE — Telephone Encounter (Signed)
-----   Message from Elspeth SHAUNNA Naval sent at 05/06/2024  9:17 AM EDT ----- Regarding: egd with dilation Can someone see if this patient would be willing to come in next Tuesday 8/12 for an EGD with dilation? She is on my wait list to do it sooner than scheduled, I have some openings for next week. Thanks

## 2024-05-06 NOTE — Telephone Encounter (Signed)
 There are no available appts on 8/12

## 2024-05-07 ENCOUNTER — Other Ambulatory Visit

## 2024-05-07 DIAGNOSIS — R1030 Lower abdominal pain, unspecified: Secondary | ICD-10-CM

## 2024-05-07 DIAGNOSIS — K529 Noninfective gastroenteritis and colitis, unspecified: Secondary | ICD-10-CM

## 2024-05-07 DIAGNOSIS — R1310 Dysphagia, unspecified: Secondary | ICD-10-CM | POA: Diagnosis not present

## 2024-05-07 DIAGNOSIS — K219 Gastro-esophageal reflux disease without esophagitis: Secondary | ICD-10-CM

## 2024-05-07 NOTE — Telephone Encounter (Signed)
 Got it, thanks [employer]

## 2024-05-12 ENCOUNTER — Ambulatory Visit: Payer: Self-pay | Admitting: Gastroenterology

## 2024-05-12 DIAGNOSIS — R1310 Dysphagia, unspecified: Secondary | ICD-10-CM

## 2024-05-12 DIAGNOSIS — K529 Noninfective gastroenteritis and colitis, unspecified: Secondary | ICD-10-CM

## 2024-05-12 DIAGNOSIS — K219 Gastro-esophageal reflux disease without esophagitis: Secondary | ICD-10-CM

## 2024-05-12 DIAGNOSIS — R195 Other fecal abnormalities: Secondary | ICD-10-CM

## 2024-05-12 LAB — CALPROTECTIN, FECAL: Calprotectin, Fecal: 320 ug/g — ABNORMAL HIGH (ref 0–120)

## 2024-05-12 MED ORDER — SUTAB 1479-225-188 MG PO TABS: ORAL_TABLET | ORAL | 0 refills | Status: DC

## 2024-05-13 ENCOUNTER — Encounter: Payer: Self-pay | Admitting: Gastroenterology

## 2024-05-24 ENCOUNTER — Ambulatory Visit (HOSPITAL_BASED_OUTPATIENT_CLINIC_OR_DEPARTMENT_OTHER): Admitting: Cardiology

## 2024-05-24 ENCOUNTER — Encounter (HOSPITAL_BASED_OUTPATIENT_CLINIC_OR_DEPARTMENT_OTHER): Payer: Self-pay | Admitting: Cardiology

## 2024-05-24 VITALS — BP 120/70 | HR 68 | Resp 17 | Ht 62.0 in | Wt 125.0 lb

## 2024-05-24 DIAGNOSIS — Z859 Personal history of malignant neoplasm, unspecified: Secondary | ICD-10-CM | POA: Diagnosis not present

## 2024-05-24 DIAGNOSIS — I700 Atherosclerosis of aorta: Secondary | ICD-10-CM | POA: Diagnosis not present

## 2024-05-24 DIAGNOSIS — Z8249 Family history of ischemic heart disease and other diseases of the circulatory system: Secondary | ICD-10-CM

## 2024-05-24 DIAGNOSIS — Z7189 Other specified counseling: Secondary | ICD-10-CM | POA: Diagnosis not present

## 2024-05-24 DIAGNOSIS — Z9889 Other specified postprocedural states: Secondary | ICD-10-CM | POA: Diagnosis not present

## 2024-05-24 DIAGNOSIS — Z712 Person consulting for explanation of examination or test findings: Secondary | ICD-10-CM

## 2024-05-24 NOTE — Patient Instructions (Signed)
 Medication Instructions:  Your physician recommends that you continue on your current medications as directed. Please refer to the Current Medication list given to you today.   *If you need a refill on your cardiac medications before your next appointment, please call your pharmacy*  Lab Work: [facility]  If you have labs (blood work) drawn today and your tests are completely normal, you will receive your results only by: MyChart Message (if you have MyChart) OR A paper copy in the mail If you have any lab test that is abnormal or we need to change your treatment, we will call you to review the results.  Testing/Procedures: NONE  Follow-Up: At [facility], you and your health needs are our priority.  As part of our continuing mission to provide you with exceptional heart care, our providers are all part of one team.  This team includes your primary Cardiologist (physician) and Advanced Practice Providers or APPs (Physician Assistants and Nurse Practitioners) who all work together to provide you with the care you need, when you need it.  Your next appointment:   12 month(s)  Provider:   Shelda Bruckner, MD, Rosaline Bane, NP, or Reche Finder, NP    We recommend signing up for the patient portal called MyChart.  Sign up information is provided on this After Visit Summary.  MyChart is used to connect with patients for Virtual Visits (Telemedicine).  Patients are able to view lab/test results, encounter notes, upcoming appointments, etc.  Non-urgent messages can be sent to your provider as well.   To learn more about what you can do with MyChart, go to [email or web address].

## 2024-05-24 NOTE — Progress Notes (Signed)
 Cardiology Office Note:  .   Date:  05/24/2024  ID:  Tammy Cooley, DOB Jul 14, 1951, MRN [id number] PCP: Cleotilde Planas, MD  [facility] Providers Cardiologist:  Shelda Bruckner, MD {  History of Present Illness: .   Tammy Cooley is a 73 y.o. female with family history of ASCVD, aortic atherosclerosis, calcium score of 0 who is seen today as a new patient evaluation for assessment of CV risk/family history of heart disease.  Today: Referral from Charmaine Bright, PA from 10/29/23 reviewed. Noted to have strong family history of heart disease, with patient's sister having MI. Clinic note from 10/28/23 reviewed under media tab. Noted that her 24 yo sister had a massive MI and passed away in 10/15/2023 of this year. Father had two prior heart attacks and a stroke, with multiple other family members with heart disease and strokes.   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 Her dermatologist recommended stopping statin. On ezetimibe currently.  Cardiovascular risk factors: Prior clinical ASCVD:  none Comorbid conditions: No hypertension, diabetes, chronic kidney disease  Metabolic syndrome/Obesity: Chronic inflammatory conditions: none Tobacco use history: never Family history: sister had not had prior issues with her heart, was a prior smoker and had undiagnosed diabetes until the time of her presentation. Developed side pain, went to ER when pain did not relent, brought right to cath lab from ER, had stent placed but developed organ failure (kidney, heart) and she passed away. Dad's first MI was in his late 36s, passed away age 21. Mother died age 55, had high blood pressure. Estranged sister, age 58, no known heart issues. Half brother with diabetes and heart issues. Prior pertinent testing and/or incidental findings: calcium score 11/2023 was 0.   Rare chest sharp focal pain over left breast, brief, nonexertional. No chest  tightness/heaviness.   ROS: Denies shortness of breath at rest or with normal exertion. No PND, orthopnea, LE edema or unexpected weight gain. No syncope or palpitations. ROS otherwise negative except as noted.   Studies Reviewed: SABRA    EKG:  EKG Interpretation Date/Time:  Monday May 24 2024 16:05:04 EDT Ventricular Rate:  60 PR Interval:  150 QRS Duration:  76 QT Interval:  392 QTC Calculation: 392 R Axis:   -29  Text Interpretation: Normal sinus rhythm Normal ECG Confirmed by Bruckner Shelda [phone]) on 05/24/2024 4:34:28 PM    Physical Exam:   VS:  BP 120/70 (BP Location: Left Arm, Patient Position: Sitting, Cuff Size: Normal)   Pulse 68   Resp 17   Ht 5' 2 (1.575 m)   Wt 125 lb (56.7 kg)   SpO2 98%   BMI 22.86 kg/m    Wt Readings from Last 3 Encounters:  05/24/24 125 lb (56.7 kg)  05/04/24 127 lb 3.2 oz (57.7 kg)  04/13/24 126 lb 8 oz (57.4 kg)    GEN: Well nourished, well developed in no acute distress HEENT: Normal, moist mucous membranes NECK: No JVD CARDIAC: regular rhythm, normal S1 and S2, no rubs or gallops. No murmur. VASCULAR: Radial and DP pulses 2+ bilaterally. No carotid bruits RESPIRATORY:  Clear to auscultation without rales, wheezing or rhonchi  ABDOMEN: Soft, non-tender, non-distended MUSCULOSKELETAL:  Ambulates independently SKIN: Warm and dry, no edema NEUROLOGIC:  Alert and oriented x 3. No focal neuro deficits noted. PSYCHIATRIC:  Normal affect    ASSESSMENT AND PLAN: .    Aortic atherosclerosis Family history of heart disease -aortic atherosclerosis  noted on CT, but calcium score 0 -reviewed statin history and squamous cell carcinoma, see below -discussed checking lp(a), that there is no current treatment available but it can help guide management. She is amenable.  History of squamous cell carcinoma -was told to stop statin given finding of squamous cell carcinoma. Reviewed mixed literature on this; isolated study from [place] with  increased risk, but other studies and meta-analyses have shown no significant increase in risk of SCC and improved outcomes with statin and non-skin SCC; also found to decrease risk of basal cell and melanoma -given uncertainty of relationship, we discussed either continuing ezetimibe, stopping ezetimibe and restarting statin, or trialing non-statin option (nexletol or PCSK9i). Will discuss available data as well as cost with our pharmacy team.  CV risk counseling and prevention -recommend heart healthy/Mediterranean diet, with whole grains, fruits, vegetable, fish, lean meats, nuts, and olive oil. Limit salt. -recommend moderate walking, 3-5 times/week for 30-50 minutes each session. Aim for at least 150 minutes/week. Goal should be pace of 3 miles/hours, or walking 1.5 miles in 30 minutes -recommend avoidance of tobacco products. Avoid excess alcohol.  Dispo: I will reach out with lp(a) results and pharmacy recommendations. Otherwise follow up in 1 year.  Signed, Shelda Bruckner, MD   Shelda Bruckner, MD, PhD, [facility] [place]  [facility]  [place]  [facility] at [facility] [address], [address] [place], [place] [address] [phone]

## 2024-05-30 DIAGNOSIS — E7800 Pure hypercholesterolemia, unspecified: Secondary | ICD-10-CM | POA: Diagnosis not present

## 2024-06-07 ENCOUNTER — Other Ambulatory Visit (HOSPITAL_COMMUNITY)
Admission: RE | Admit: 2024-06-07 | Discharge: 2024-06-07 | Disposition: A | Discharge status: Home / Self Care | Source: Ambulatory Visit | Attending: Cardiology | Admitting: Cardiology

## 2024-06-07 DIAGNOSIS — I700 Atherosclerosis of aorta: Secondary | ICD-10-CM | POA: Insufficient documentation

## 2024-06-07 DIAGNOSIS — Z8249 Family history of ischemic heart disease and other diseases of the circulatory system: Secondary | ICD-10-CM | POA: Insufficient documentation

## 2024-06-08 ENCOUNTER — Encounter: Admitting: Gastroenterology

## 2024-06-08 LAB — LIPOPROTEIN A (LPA): Lipoprotein (a): 20.1 nmol/L (ref <75.0)

## 2024-06-15 ENCOUNTER — Ambulatory Visit: Admitting: Gastroenterology

## 2024-06-15 ENCOUNTER — Encounter: Payer: Self-pay | Admitting: Gastroenterology

## 2024-06-15 VITALS — BP 140/82 | HR 56 | Temp 97.2°F | Resp 15 | Ht 62.0 in | Wt 126.0 lb

## 2024-06-15 DIAGNOSIS — K5730 Diverticulosis of large intestine without perforation or abscess without bleeding: Secondary | ICD-10-CM | POA: Diagnosis not present

## 2024-06-15 DIAGNOSIS — K222 Esophageal obstruction: Secondary | ICD-10-CM

## 2024-06-15 DIAGNOSIS — K52832 Lymphocytic colitis: Secondary | ICD-10-CM | POA: Diagnosis not present

## 2024-06-15 DIAGNOSIS — K449 Diaphragmatic hernia without obstruction or gangrene: Secondary | ICD-10-CM | POA: Diagnosis not present

## 2024-06-15 DIAGNOSIS — K529 Noninfective gastroenteritis and colitis, unspecified: Secondary | ICD-10-CM | POA: Diagnosis not present

## 2024-06-15 DIAGNOSIS — R195 Other fecal abnormalities: Secondary | ICD-10-CM | POA: Diagnosis not present

## 2024-06-15 DIAGNOSIS — K219 Gastro-esophageal reflux disease without esophagitis: Secondary | ICD-10-CM

## 2024-06-15 DIAGNOSIS — R1310 Dysphagia, unspecified: Secondary | ICD-10-CM

## 2024-06-15 DIAGNOSIS — K648 Other hemorrhoids: Secondary | ICD-10-CM

## 2024-06-15 MED ORDER — SODIUM CHLORIDE 0.9 % IV SOLN: 500.0000 mL | Freq: Once | INTRAVENOUS | Status: AC

## 2024-06-15 NOTE — Progress Notes (Signed)
 [facility] History and Physical   Primary Care Physician:  Cleotilde Planas, MD   Reason for Procedure:  DYsphagia / GERD, loose stools / elevated fecal calprotectin  Plan:    EGD with possible dilation, colonoscopy     HPI: Tammy Cooley is a 73 y.o. female  here for EGD and colonoscopy. History of GERD, GEJ Stricture leading to dilation in the past with relief of dysphagia, has since recurred. EGD to reassess and dilate as needed. Protonix 40mg  / day started since last visit.  Also had nonspecific inflammation on remote colonoscopy. Diarrhea recurred recently and elevated fecal calprotectin noted. Colonoscopy to further evaluate.    No family history of colon cancer known. Otherwise feels well without any cardiopulmonary symptoms.   I have discussed risks / benefits of anesthesia and endoscopic procedure with Tammy Cooley and they wish to proceed with the exams as outlined today.    Past Medical History:  Diagnosis Date   Arthritis    knees   Back pain    left side hip and groin   Chronic headaches    has a hx of migraines, as of 06/11/22 patient only gets about twice a week   Colon polyp, hyperplastic    COVID-19 2022   Treated with Paxlovid   Diverticulitis 2022   Esophageal stricture    06/28/21 esophagus dilated, Dr. Elspeth Naval @ [facility]   Family history of adverse reaction to anesthesia    Mom and sister have nausea and vomiting post-operatively.   GERD (gastroesophageal reflux disease)    Follows with Dr. Elspeth Naval at [facility]   History of kidney stones    History of shingles 2010   HLD (hyperlipidemia)    Follows with Planas Cleotilde, MD @ [facility], lov 12/26/21.   Hypercholesteremia    IBS (irritable bowel syndrome)    OA (osteoarthritis) of knee    bilateral   OP (osteoporosis)    PONV (postoperative nausea and vomiting)    Seasonal allergies    Squamous cell carcinoma in situ (SCCIS) of skin    Status post dilation of  esophageal narrowing 06/28/2021   Dr. Elspeth Naval   Vitamin B 12 deficiency     Past Surgical History:  Procedure Laterality Date   ABDOMINAL HYSTERECTOMY     total 1999   ARTHRODESIS METATARSALPHALANGEAL JOINT (MTPJ) Left 09/17/2018   Procedure: left hallux metatarsal phalangeal joint arthrodesis and 2nd metatarsal Weil osteotomy;  Surgeon: Kit Rush, MD;  Location: [facility];  Service: Orthopedics;  Laterality: Left;    BUNIONECTOMY Left    CERVICAL FUSION     around 2008 or 2009   COLONOSCOPY W/ POLYPECTOMY  06/28/2021   with an EGD   FOOT SURGERY Left 2020   corrective surgery   left knee arthroscopy     Dr. Duwayne   right knee arthroscopy     Dr. Baird   TONSILLECTOMY AND ADENOIDECTOMY  1971    Prior to Admission medications   Medication Sig Start Date End Date Taking? Authorizing Provider  ezetimibe (ZETIA) 10 MG tablet Take 10 mg by mouth daily.   Yes [provider]  loratadine (CLARITIN) 10 MG tablet Take 10 mg by mouth as needed.   Yes [provider]  omeprazole  (PRILOSEC) 40 MG capsule Take 1 capsule by mouth once to twice daily 04/13/24  Yes Amauri Keefe, Elspeth SQUIBB, MD  Besifloxacin HCl (BESIVANCE) 0.6 % SUSP STARTING 3 DAYS PRIOR TO SURGERY INSTILL  1 DROP 3 TIMES PER DAY INTO OPERATIVE EYE AS DIRECTED Patient not taking: Reported on 06/15/2024    [provider]  hyoscyamine  (LEVSIN  SL) 0.125 MG SL tablet Place 1 tablet (0.125 mg total) under the tongue in the morning, at noon, and at bedtime. Patient not taking: Reported on 06/15/2024 04/13/24   Leigh Elspeth SQUIBB, MD  naproxen sodium (ALEVE) 220 MG tablet Take 220 mg by mouth daily as needed.    [provider]    Current Outpatient Medications  Medication Sig Dispense Refill   ezetimibe (ZETIA) 10 MG tablet Take 10 mg by mouth daily.     loratadine (CLARITIN) 10 MG tablet Take 10 mg by mouth as needed.     omeprazole  (PRILOSEC) 40 MG capsule Take 1  capsule by mouth once to twice daily 60 capsule 2   Besifloxacin HCl (BESIVANCE) 0.6 % SUSP STARTING 3 DAYS PRIOR TO SURGERY INSTILL 1 DROP 3 TIMES PER DAY INTO OPERATIVE EYE AS DIRECTED (Patient not taking: Reported on 06/15/2024)     hyoscyamine  (LEVSIN  SL) 0.125 MG SL tablet Place 1 tablet (0.125 mg total) under the tongue in the morning, at noon, and at bedtime. (Patient not taking: Reported on 06/15/2024) 90 tablet 2   naproxen sodium (ALEVE) 220 MG tablet Take 220 mg by mouth daily as needed.     Current Facility-Administered Medications  Medication Dose Route Frequency Provider Last Rate Last Admin   0.9 %  sodium chloride  infusion  500 mL Intravenous Once Erisa Mehlman, Elspeth SQUIBB, MD        Allergies as of 06/15/2024 - Review Complete 06/15/2024  Allergen Reaction Noted   Cephalexin  Rash 07/20/2022    Family History  Problem Relation Age of Onset   Breast cancer Mother    Colon polyps Mother    Alcoholism Father    Colon polyps Father    Heart disease Father    Heart disease Sister    Diabetes Brother     Social History   Socioeconomic History   Marital status: Married    Spouse name: Not on file   Number of children: 1   Years of education: Not on file   Highest education level: Not on file  Occupational History   Occupation: Production manager  Tobacco Use   Smoking status: Never   Smokeless tobacco: Never  Vaping Use   Vaping status: Never Used  Substance and Sexual Activity   Alcohol use: No    Alcohol/week: 0.0 standard drinks of alcohol   Drug use: No   Sexual activity: Not on file  Other Topics Concern   Not on file  Social History Narrative   Not on file   Social Drivers of Health   Financial Resource Strain: Not on file  Food Insecurity: Not on file  Transportation Needs: Not on file  Physical Activity: Not on file  Stress: Not on file  Social Connections: Unknown (02/08/2022)   Received from [facility]   Social Network    Social Network: Not on file   Intimate Partner Violence: Unknown (12/31/2021)   Received from [facility]   HITS    Physically Hurt: Not on file    Insult or Talk Down To: Not on file    Threaten Physical Harm: Not on file    Scream or Curse: Not on file    Review of Systems: All other review of systems negative except as mentioned in the HPI.  Physical Exam: Vital signs BP (!) 145/78  Pulse 97   Temp (!) 97.2 F (36.2 C) (Temporal)   Ht 5' 2 (1.575 m)   Wt 126 lb (57.2 kg)   SpO2 99%   BMI 23.05 kg/m   General:   Alert,  Well-developed, pleasant and cooperative in NAD Lungs:  Clear throughout to auscultation.   Heart:  Regular rate and rhythm Abdomen:  Soft, nontender and nondistended.   Neuro/Psych:  Alert and cooperative. Normal mood and affect. A and O x 3  Marcey Naval, MD [facility]

## 2024-06-15 NOTE — Progress Notes (Signed)
 Called to room to assist during endoscopic procedure.  Patient ID and intended procedure confirmed with present staff. Received instructions for my participation in the procedure from the performing physician.

## 2024-06-15 NOTE — Progress Notes (Signed)
 A/o x 3, VSS, gd SR's, pleased with anesthesia, report to RN

## 2024-06-15 NOTE — Op Note (Signed)
 [facility] Patient Name: Tammy Cooley Procedure Date: 06/15/2024 2:12 PM MRN: [id number] Endoscopist: Elspeth P. Leigh , MD, [id number] Age: 73 Referring MD:  Date of Birth: 06-11-1951 Gender: Female Account #: [id number] Procedure:                Upper GI endoscopy Indications:              Dysphagia - history of GEJ stricture, has responded                            to dilation in the past, follow-up of                            gastro-esophageal reflux disease - on protonix 40mg                             / day, with somewhat better control of reflux Medicines:                Monitored Anesthesia Care Procedure:                Pre-Anesthesia Assessment:                           - Prior to the procedure, a History and Physical                            was performed, and patient medications and                            allergies were reviewed. The patient's tolerance of                            previous anesthesia was also reviewed. The risks                            and benefits of the procedure and the sedation                            options and risks were discussed with the patient.                            All questions were answered, and informed consent                            was obtained. Prior Anticoagulants: The patient has                            taken no anticoagulant or antiplatelet agents. ASA                            Grade Assessment: II - A patient with mild systemic                            disease. After reviewing the risks and benefits,  the patient was deemed in satisfactory condition to                            undergo the procedure.                           After obtaining informed consent, the endoscope was                            passed under direct vision. Throughout the                            procedure, the patient's blood pressure, pulse, and                            oxygen  saturations were monitored continuously. The                            GIF [id number] #[id number] was introduced through the                            mouth, and advanced to the second part of duodenum.                            The upper GI endoscopy was accomplished without                            difficulty. The patient tolerated the procedure                            well. Scope In: Scope Out: Findings:                 The Z-line was regular.                           A 2 cm hiatal hernia was present.                           One benign-appearing, intrinsic mild stenosis was                            found at the GEJ. This stenosis measured 1 cm (in                            length). A TTS dilator was passed through the                            scope. Dilation with an 18-19-20 mm balloon dilator                            was performed to 20 mm. The stricture was then                            biopsied with a  cold forceps to open the stricture                            further.                           The exam of the esophagus was otherwise normal.                           The entire examined stomach was normal.                           The examined duodenum was normal. Complications:            No immediate complications. Estimated blood loss:                            Minimal. Estimated Blood Loss:     Estimated blood loss was minimal. Impression:               - Z-line regular.                           - 2 cm hiatal hernia.                           - Benign-appearing esophageal stenosis. Dilated to                            20mm. Biopsied.                           - Normal esophagus otherwise.                           - Normal stomach.                           - Normal examined duodenum. Recommendation:           - Patient has a contact number available for                            emergencies. The signs and symptoms of potential                             delayed complications were discussed with the                            patient. Return to normal activities tomorrow.                            Written discharge instructions were provided to the                            patient.                           - Resume previous diet.                           -  Continue present medications.                           - Await course post dilation Kyrell Ruacho P. Tanita Palinkas, MD 06/15/2024 3:01:37 PM This report has been signed electronically.

## 2024-06-15 NOTE — Op Note (Signed)
  [facility] Patient Name: Tammy Cooley Procedure Date: 06/15/2024 2:11 PM MRN: [id number] Endoscopist: Elspeth P. Leigh , MD, [id number] Age: 73 Referring MD:  Date of Birth: September 11, 1951 Gender: Female Account #: [id number] Procedure:                Colonoscopy Indications:              Chronic diarrhea, elevated fecal calprotectin to                            320 - prior colonoscopy in 2022 showed some                            nonspecific acute inflammation on biopsies. Was on                            NSAIDs but has since stopped Medicines:                Monitored Anesthesia Care Procedure:                Pre-Anesthesia Assessment:                           - Prior to the procedure, a History and Physical                            was performed, and patient medications and                            allergies were reviewed. The patient's tolerance of                            previous anesthesia was also reviewed. The risks                            and benefits of the procedure and the sedation                            options and risks were discussed with the patient.                            All questions were answered, and informed consent                            was obtained. Prior Anticoagulants: The patient has                            taken no anticoagulant or antiplatelet agents. ASA                            Grade Assessment: II - A patient with mild systemic                            disease. After reviewing the risks and benefits,  the patient was deemed in satisfactory condition to                            undergo the procedure.                           After obtaining informed consent, the colonoscope                            was passed under direct vision. Throughout the                            procedure, the patient's blood pressure, pulse, and                            oxygen saturations were monitored  continuously. The                            Olympus Scope SN: [id number] was introduced through                            the anus and advanced to the the terminal ileum,                            with identification of the appendiceal orifice and                            IC valve. The colonoscopy was performed without                            difficulty. The patient tolerated the procedure                            well. The quality of the bowel preparation was                            adequate. The terminal ileum, ileocecal valve,                            appendiceal orifice, and rectum were photographed. Scope In: 2:32:28 PM Scope Out: 2:48:00 PM Scope Withdrawal Time: 0 hours 11 minutes 42 seconds  Total Procedure Duration: 0 hours 15 minutes 32 seconds  Findings:                 The perianal and digital rectal examinations were                            normal.                           The terminal ileum appeared normal.                           A few small-mouthed diverticula were found in the  sigmoid colon.                           Internal hemorrhoids were found during retroflexion.                           The exam was otherwise without abnormality. Of                            note, prep was adequate but residual vegetable                            matter (corn) was present in dependant portions of                            the colon which took some time to clear.                           Biopsies for histology were taken with a cold                            forceps from the right colon, left colon and                            transverse colon for evaluation of microscopic                            colitis. Complications:            No immediate complications. Estimated blood loss:                            Minimal. Estimated Blood Loss:     Estimated blood loss was minimal. Impression:               - The examined portion of  the ileum was normal.                           - Diverticulosis in the sigmoid colon.                           - Internal hemorrhoids.                           - The examination was otherwise normal.                           - Biopsies were taken with a cold forceps from the                            right colon, left colon and transverse colon for                            evaluation of microscopic colitis. Recommendation:           - Patient has a contact number available for  emergencies. The signs and symptoms of potential                            delayed complications were discussed with the                            patient. Return to normal activities tomorrow.                            Written discharge instructions were provided to the                            patient.                           - Resume previous diet.                           - Continue present medications.                           - Await pathology results. Elspeth P. Tammy Navarez, MD 06/15/2024 2:54:09 PM This report has been signed electronically.

## 2024-06-15 NOTE — Patient Instructions (Signed)

## 2024-06-16 ENCOUNTER — Telehealth: Payer: Self-pay

## 2024-06-16 NOTE — Telephone Encounter (Signed)
 Follow up call to pt, lm for pt to call if having any difficulty with normal activities or eating and drinking.  Also to call if any other questions or concerns.

## 2024-06-18 LAB — SURGICAL PATHOLOGY

## 2024-06-20 ENCOUNTER — Ambulatory Visit: Payer: Self-pay | Admitting: Gastroenterology

## 2024-06-21 MED ORDER — BUDESONIDE 3 MG PO CPEP: ORAL_CAPSULE | ORAL | 0 refills | Status: AC

## 2024-06-29 DIAGNOSIS — E7800 Pure hypercholesterolemia, unspecified: Secondary | ICD-10-CM | POA: Diagnosis not present

## 2024-07-08 ENCOUNTER — Ambulatory Visit (HOSPITAL_BASED_OUTPATIENT_CLINIC_OR_DEPARTMENT_OTHER): Payer: Self-pay | Admitting: Cardiology

## 2024-07-27 ENCOUNTER — Telehealth: Payer: Self-pay | Admitting: Gastroenterology

## 2024-07-27 MED ORDER — COLESTIPOL HCL 1 G PO TABS: 1.0000 g | ORAL_TABLET | Freq: Two times a day (BID) | ORAL | 1 refills | Status: AC

## 2024-07-27 NOTE — Telephone Encounter (Signed)
 Patient requesting to speak with a nurse in regards to abdominal pain. Please advise.

## 2024-07-27 NOTE — Telephone Encounter (Signed)
 Patient is advised of Dr Kenith response/recommendations and she verbalizes understanding. Colestid  Rx sent to pharmacy. Patient is advised to call back if symptoms have not improved in about 2 weeks.

## 2024-07-27 NOTE — Telephone Encounter (Signed)
 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 She states that she is having at least 5 diarrheal bowel movements daily with explosive gas and fecal urgency. She describes it feels like a thunderstorm in my belly. State she feels rolling and constantly hears loud noises from her stomach. She says she has some lower abdominal pain intermittently but has dealt with this part for years. Denies any blood in stool. Patient also says she take imodium 1 tablet daily which is also not effective.  Patient has follow up appointment 09/21/24 with Dr Leigh.  Dr Leigh, please advise.SABRASABRA

## 2024-07-27 NOTE — Telephone Encounter (Signed)
 Okay sorry to hear this. I am surprised that the budesonide  has not helped yet, however I would continue that for now to see if more time on it will help. I would increase immodium to scheduledTID and can titrate as needed. I'd also add colestid  1gm BID to this regimen and see if that helps. If no better in the next 2 weeks call us  back for further advice.

## 2024-07-30 DIAGNOSIS — E7800 Pure hypercholesterolemia, unspecified: Secondary | ICD-10-CM | POA: Diagnosis not present

## 2024-08-16 DIAGNOSIS — M7711 Lateral epicondylitis, right elbow: Secondary | ICD-10-CM | POA: Diagnosis not present

## 2024-08-16 DIAGNOSIS — M25562 Pain in left knee: Secondary | ICD-10-CM | POA: Diagnosis not present

## 2024-09-21 ENCOUNTER — Ambulatory Visit: Admitting: Gastroenterology

## 2024-10-15 ENCOUNTER — Ambulatory Visit: Admitting: Nurse Practitioner

## 2024-11-15 ENCOUNTER — Ambulatory Visit: Admitting: Gastroenterology
